# Patient Record
Sex: Female | Born: 1945 | Race: Black or African American | Hispanic: No | Marital: Married | Smoking: Never smoker
Health system: Southern US, Community
[De-identification: ages and names within clinical notes are randomized; demographics above are authoritative.]

## PROBLEM LIST (undated history)

## (undated) DIAGNOSIS — Z853 Personal history of malignant neoplasm of breast: Secondary | ICD-10-CM

## (undated) DIAGNOSIS — K649 Unspecified hemorrhoids: Secondary | ICD-10-CM

## (undated) DIAGNOSIS — C50919 Malignant neoplasm of unspecified site of unspecified female breast: Secondary | ICD-10-CM

## (undated) DIAGNOSIS — I341 Nonrheumatic mitral (valve) prolapse: Secondary | ICD-10-CM

## (undated) DIAGNOSIS — M2550 Pain in unspecified joint: Secondary | ICD-10-CM

## (undated) DIAGNOSIS — E785 Hyperlipidemia, unspecified: Secondary | ICD-10-CM

## (undated) DIAGNOSIS — J302 Other seasonal allergic rhinitis: Secondary | ICD-10-CM

## (undated) DIAGNOSIS — L659 Nonscarring hair loss, unspecified: Secondary | ICD-10-CM

## (undated) DIAGNOSIS — C50412 Malignant neoplasm of upper-outer quadrant of left female breast: Secondary | ICD-10-CM

## (undated) DIAGNOSIS — D509 Iron deficiency anemia, unspecified: Secondary | ICD-10-CM

## (undated) DIAGNOSIS — J309 Allergic rhinitis, unspecified: Secondary | ICD-10-CM

## (undated) DIAGNOSIS — Z8679 Personal history of other diseases of the circulatory system: Secondary | ICD-10-CM

## (undated) DIAGNOSIS — Z803 Family history of malignant neoplasm of breast: Secondary | ICD-10-CM

## (undated) DIAGNOSIS — K449 Diaphragmatic hernia without obstruction or gangrene: Secondary | ICD-10-CM

## (undated) DIAGNOSIS — M1990 Unspecified osteoarthritis, unspecified site: Secondary | ICD-10-CM

## (undated) HISTORY — DX: Malignant neoplasm of unspecified site of unspecified female breast: C50.919

## (undated) HISTORY — DX: Iron deficiency anemia, unspecified: D50.9

## (undated) HISTORY — DX: Malignant neoplasm of upper-outer quadrant of left female breast: C50.412

## (undated) HISTORY — DX: Personal history of malignant neoplasm of breast: Z85.3

## (undated) HISTORY — DX: Nonrheumatic mitral (valve) prolapse: I34.1

## (undated) HISTORY — PX: OTHER SURGICAL HISTORY: SHX169

## (undated) HISTORY — DX: Hyperlipidemia, unspecified: E78.5

## (undated) HISTORY — DX: Other seasonal allergic rhinitis: J30.2

## (undated) HISTORY — DX: Diaphragmatic hernia without obstruction or gangrene: K44.9

## (undated) HISTORY — DX: Unspecified osteoarthritis, unspecified site: M19.90

## (undated) HISTORY — DX: Personal history of other diseases of the circulatory system: Z86.79

## (undated) HISTORY — DX: Pain in unspecified joint: M25.50

## (undated) HISTORY — DX: Unspecified hemorrhoids: K64.9

## (undated) HISTORY — DX: Allergic rhinitis, unspecified: J30.9

## (undated) HISTORY — DX: Nonscarring hair loss, unspecified: L65.9

## (undated) HISTORY — DX: Family history of malignant neoplasm of breast: Z80.3

---

## 1997-12-13 DIAGNOSIS — C50919 Malignant neoplasm of unspecified site of unspecified female breast: Secondary | ICD-10-CM

## 1997-12-13 HISTORY — DX: Malignant neoplasm of unspecified site of unspecified female breast: C50.919

## 1997-12-13 HISTORY — PX: BREAST LUMPECTOMY: SHX2

## 1998-10-09 ENCOUNTER — Other Ambulatory Visit: Admission: RE | Admit: 1998-10-09 | Discharge: 1998-10-09 | Payer: Self-pay | Admitting: Internal Medicine

## 1998-10-10 ENCOUNTER — Ambulatory Visit (HOSPITAL_COMMUNITY): Admission: RE | Admit: 1998-10-10 | Discharge: 1998-10-10 | Payer: Self-pay | Admitting: Internal Medicine

## 1998-11-05 ENCOUNTER — Ambulatory Visit (HOSPITAL_COMMUNITY): Admission: RE | Admit: 1998-11-05 | Discharge: 1998-11-05 | Payer: Self-pay | Admitting: Internal Medicine

## 1998-11-05 ENCOUNTER — Encounter: Payer: Self-pay | Admitting: Internal Medicine

## 1998-11-19 ENCOUNTER — Encounter (HOSPITAL_BASED_OUTPATIENT_CLINIC_OR_DEPARTMENT_OTHER): Payer: Self-pay | Admitting: General Surgery

## 1998-11-21 ENCOUNTER — Encounter (HOSPITAL_BASED_OUTPATIENT_CLINIC_OR_DEPARTMENT_OTHER): Payer: Self-pay | Admitting: General Surgery

## 1998-11-21 ENCOUNTER — Ambulatory Visit (HOSPITAL_COMMUNITY): Admission: RE | Admit: 1998-11-21 | Discharge: 1998-11-21 | Payer: Self-pay | Admitting: General Surgery

## 1998-12-30 ENCOUNTER — Encounter: Admission: RE | Admit: 1998-12-30 | Discharge: 1999-03-30 | Payer: Self-pay | Admitting: *Deleted

## 1999-11-02 ENCOUNTER — Encounter: Admission: RE | Admit: 1999-11-02 | Discharge: 1999-11-02 | Payer: Self-pay | Admitting: Internal Medicine

## 2000-02-17 ENCOUNTER — Ambulatory Visit (HOSPITAL_COMMUNITY): Admission: RE | Admit: 2000-02-17 | Discharge: 2000-02-17 | Payer: Self-pay | Admitting: Gastroenterology

## 2000-02-18 ENCOUNTER — Ambulatory Visit (HOSPITAL_COMMUNITY): Admission: RE | Admit: 2000-02-18 | Discharge: 2000-02-18 | Payer: Self-pay | Admitting: Gastroenterology

## 2000-02-18 ENCOUNTER — Encounter: Payer: Self-pay | Admitting: Gastroenterology

## 2000-03-04 ENCOUNTER — Encounter: Payer: Self-pay | Admitting: Gastroenterology

## 2000-03-04 ENCOUNTER — Ambulatory Visit (HOSPITAL_COMMUNITY): Admission: RE | Admit: 2000-03-04 | Discharge: 2000-03-04 | Payer: Self-pay | Admitting: Gastroenterology

## 2000-09-16 ENCOUNTER — Other Ambulatory Visit: Admission: RE | Admit: 2000-09-16 | Discharge: 2000-09-30 | Payer: Self-pay

## 2000-11-02 ENCOUNTER — Encounter: Admission: RE | Admit: 2000-11-02 | Discharge: 2000-11-02 | Payer: Self-pay | Admitting: Internal Medicine

## 2000-11-02 ENCOUNTER — Encounter: Payer: Self-pay | Admitting: Internal Medicine

## 2002-03-23 ENCOUNTER — Other Ambulatory Visit: Admission: RE | Admit: 2002-03-23 | Discharge: 2002-03-23 | Payer: Self-pay | Admitting: Internal Medicine

## 2004-10-18 ENCOUNTER — Ambulatory Visit: Payer: Self-pay | Admitting: Oncology

## 2005-01-22 ENCOUNTER — Ambulatory Visit: Payer: Self-pay | Admitting: Oncology

## 2005-03-12 ENCOUNTER — Encounter: Admission: RE | Admit: 2005-03-12 | Discharge: 2005-03-12 | Payer: Self-pay | Admitting: Internal Medicine

## 2005-03-12 ENCOUNTER — Other Ambulatory Visit: Admission: RE | Admit: 2005-03-12 | Discharge: 2005-03-12 | Payer: Self-pay | Admitting: Internal Medicine

## 2005-06-08 ENCOUNTER — Emergency Department (HOSPITAL_COMMUNITY): Admission: EM | Admit: 2005-06-08 | Discharge: 2005-06-08 | Payer: Self-pay | Admitting: Emergency Medicine

## 2005-06-09 ENCOUNTER — Ambulatory Visit (HOSPITAL_COMMUNITY): Admission: RE | Admit: 2005-06-09 | Discharge: 2005-06-09 | Payer: Self-pay | Admitting: Internal Medicine

## 2005-11-29 ENCOUNTER — Ambulatory Visit: Payer: Self-pay | Admitting: Oncology

## 2006-03-24 ENCOUNTER — Other Ambulatory Visit: Admission: RE | Admit: 2006-03-24 | Discharge: 2006-03-24 | Payer: Self-pay | Admitting: Internal Medicine

## 2006-12-01 ENCOUNTER — Ambulatory Visit: Payer: Self-pay | Admitting: Oncology

## 2007-03-22 ENCOUNTER — Ambulatory Visit: Payer: Self-pay | Admitting: Oncology

## 2007-04-06 ENCOUNTER — Other Ambulatory Visit: Admission: RE | Admit: 2007-04-06 | Discharge: 2007-04-06 | Payer: Self-pay | Admitting: Internal Medicine

## 2007-06-22 ENCOUNTER — Ambulatory Visit: Payer: Self-pay | Admitting: Oncology

## 2007-10-11 ENCOUNTER — Ambulatory Visit: Payer: Self-pay | Admitting: Oncology

## 2008-04-09 ENCOUNTER — Ambulatory Visit: Payer: Self-pay | Admitting: Oncology

## 2008-06-20 ENCOUNTER — Other Ambulatory Visit: Admission: RE | Admit: 2008-06-20 | Discharge: 2008-06-20 | Payer: Self-pay | Admitting: Internal Medicine

## 2008-06-29 ENCOUNTER — Emergency Department (HOSPITAL_COMMUNITY): Admission: EM | Admit: 2008-06-29 | Discharge: 2008-06-29 | Payer: Self-pay | Admitting: Emergency Medicine

## 2009-01-16 ENCOUNTER — Ambulatory Visit: Payer: Self-pay | Admitting: Oncology

## 2009-04-01 ENCOUNTER — Other Ambulatory Visit: Payer: Self-pay | Admitting: Vascular Surgery

## 2009-04-08 ENCOUNTER — Other Ambulatory Visit: Payer: Self-pay | Admitting: Vascular Surgery

## 2009-04-29 ENCOUNTER — Other Ambulatory Visit: Payer: Self-pay | Admitting: Vascular Surgery

## 2009-05-03 ENCOUNTER — Other Ambulatory Visit: Payer: Self-pay | Admitting: Vascular Surgery

## 2009-05-19 ENCOUNTER — Other Ambulatory Visit: Payer: Self-pay | Admitting: Vascular Surgery

## 2009-09-05 ENCOUNTER — Other Ambulatory Visit: Admission: RE | Admit: 2009-09-05 | Discharge: 2009-09-05 | Payer: Self-pay | Admitting: Internal Medicine

## 2009-09-21 ENCOUNTER — Other Ambulatory Visit: Payer: Self-pay | Admitting: Emergency Medicine

## 2009-10-26 ENCOUNTER — Other Ambulatory Visit: Payer: Self-pay | Admitting: Emergency Medicine

## 2009-10-26 ENCOUNTER — Other Ambulatory Visit: Payer: Self-pay | Admitting: Vascular Surgery

## 2009-11-14 ENCOUNTER — Other Ambulatory Visit: Payer: Self-pay | Admitting: Surgery

## 2009-11-19 ENCOUNTER — Encounter: Admission: RE | Admit: 2009-11-19 | Discharge: 2009-11-19 | Payer: Self-pay | Admitting: Sports Medicine

## 2010-01-19 ENCOUNTER — Other Ambulatory Visit: Payer: Self-pay | Admitting: Nephrology

## 2010-11-06 ENCOUNTER — Emergency Department (HOSPITAL_COMMUNITY): Admission: EM | Admit: 2010-11-06 | Discharge: 2010-11-06 | Payer: Self-pay | Admitting: Emergency Medicine

## 2011-01-29 ENCOUNTER — Other Ambulatory Visit: Payer: Self-pay | Admitting: Internal Medicine

## 2011-02-23 LAB — POCT I-STAT, CHEM 8
BUN: 12 mg/dL (ref 6–23)
Calcium, Ion: 1.19 mmol/L (ref 1.12–1.32)
Chloride: 106 mEq/L (ref 96–112)
HCT: 38 % (ref 36.0–46.0)
Potassium: 3.6 mEq/L (ref 3.5–5.1)

## 2011-02-23 LAB — POCT CARDIAC MARKERS
CKMB, poc: <1 ng/mL — ABNORMAL LOW (ref 1.0–8.0)
Troponin i, poc: <0.05 ng/mL (ref 0.00–0.09)
Troponin i, poc: <0.05 ng/mL (ref 0.00–0.09)

## 2011-03-16 LAB — POCT I-STAT 4, (NA,K, GLUC, HGB,HCT)
Glucose, Bld: 101 mg/dL — ABNORMAL HIGH (ref 70–99)
HCT: 46 % (ref 36.0–46.0)
Hemoglobin: 15.6 g/dL — ABNORMAL HIGH (ref 12.0–15.0)
Potassium: 4.1 mEq/L (ref 3.5–5.1)

## 2011-03-17 LAB — PROTIME-INR
INR: 1.05 (ref 0.00–1.49)
Prothrombin Time: 13.6 seconds (ref 11.6–15.2)

## 2011-03-17 LAB — POCT I-STAT, CHEM 8
Calcium, Ion: 1.1 mmol/L — ABNORMAL LOW (ref 1.12–1.32)
Glucose, Bld: 99 mg/dL (ref 70–99)
HCT: 45 % (ref 36.0–46.0)
Hemoglobin: 15.3 g/dL — ABNORMAL HIGH (ref 12.0–15.0)
TCO2: 22 mmol/L (ref 0–100)

## 2011-03-18 LAB — POCT I-STAT, CHEM 8
BUN: 88 mg/dL — ABNORMAL HIGH (ref 6–23)
Calcium, Ion: 1.04 mmol/L — ABNORMAL LOW (ref 1.12–1.32)
HCT: 46 % (ref 36.0–46.0)
Hemoglobin: 15.6 g/dL — ABNORMAL HIGH (ref 12.0–15.0)
Sodium: 140 mEq/L (ref 135–145)
TCO2: 28 mmol/L (ref 0–100)

## 2011-03-22 LAB — POCT I-STAT 4, (NA,K, GLUC, HGB,HCT)
Hemoglobin: 12.9 g/dL (ref 12.0–15.0)
Potassium: 3.7 mEq/L (ref 3.5–5.1)
Sodium: 137 mEq/L (ref 135–145)

## 2011-03-23 LAB — POCT I-STAT 4, (NA,K, GLUC, HGB,HCT)
Glucose, Bld: 106 mg/dL — ABNORMAL HIGH (ref 70–99)
HCT: 43 % (ref 36.0–46.0)
Hemoglobin: 14.6 g/dL (ref 12.0–15.0)
Potassium: 3.4 mEq/L — ABNORMAL LOW (ref 3.5–5.1)

## 2011-03-24 LAB — POCT I-STAT 4, (NA,K, GLUC, HGB,HCT)
Glucose, Bld: 99 mg/dL (ref 70–99)
HCT: 45 % (ref 36.0–46.0)
HCT: 48 % — ABNORMAL HIGH (ref 36.0–46.0)
Hemoglobin: 15.3 g/dL — ABNORMAL HIGH (ref 12.0–15.0)
Hemoglobin: 16.3 g/dL — ABNORMAL HIGH (ref 12.0–15.0)
Sodium: 135 mEq/L (ref 135–145)
Sodium: 137 mEq/L (ref 135–145)

## 2011-04-30 NOTE — Procedures (Signed)
[place]. [facility]  Patient:    Jeanne Mckinney, Jeanne Mckinney                      MRN: [id number] Proc. Date: 02/17/00 Adm. Date:  [id number] Attending:  Charna Elizabeth CC:         Lind Guest. August Saucer, M.D.                           Procedure Report  DATE OF BIRTH:  June 04, 1946  REFERRING PHYSICIAN:  Lind Guest. August Saucer, M.D.  PROCEDURE PERFORMED:  Colonoscopy.  ENDOSCOPIST:  Anselmo Rod, M.D.  INSTRUMENTS USED:  Olympus video colonoscope.  INDICATION FOR PROCEDURE:  Iron deficiency anemia in a 65 year old black female  with a personal history of breast cancer.   Rule out polyps, masses, erosions or ulcerations, etc.  PREPROCEDURE PREPARATION:  Informed consent was procured from the patient. The patient was fasted for eight hours prior to the procedure and prepped with a bottle of magnesium citrate and a gallon of NuLytely the night prior to the procedure.   PREPROCEDURE PHYSICAL EXAMINATION:  VITAL SIGNS:  The patient had stable vital signs.  NECK:  Supple.  CHEST:  Clear to auscultation, S1, S2 regular.  ABDOMEN:  Soft with normal abdominal bowel sounds.  DESCRIPTION OF PROCEDURE:  The patient was placed in the left lateral decubitus  position and sedated with 50 mg of Demerol and 5 mg of Versed intravenously. Once the patient was adequately sedated and maintained on low-flow oxygen with continuous cardiac monitoring, the Olympus video colonoscope was advanced from he rectum to the cecum without difficulty.  The entire colon appeared normal with  healthy vascular pattern.  No masses, polyps, erosions, ulcerations, or diverticular disease was seen.  IMPRESSION:  Normal colonoscopy.  RECOMMENDATIONS: 1. To further evaluate the patient following the patients anemia. An upper GI and    small-bowel follow-through has been scheduled for her. 2. She is to follow up in the office for further recommendations. DD:  02/17/00 TD:  02/18/00 Job:  [id number]

## 2011-04-30 NOTE — Procedures (Signed)
[place]. [facility]  Patient:    Jeanne Mckinney, Jeanne Mckinney                      MRN: [id number] Proc. Date: 02/17/00 Adm. Date:  [id number] Attending:  Charna Elizabeth CC:         Lind Guest. August Saucer, M.D.                           Procedure Report  DATE OF BIRTH:  November 26, 1946  REFERRING PHYSICIAN:  Lind Guest. August Saucer, M.D.  PROCEDURE PERFORMED:  Colonoscopy.  ENDOSCOPIST:  Anselmo Rod, M.D.  INSTRUMENT USED:  Olympus video colonoscope.  INDICATIONS FOR PROCEDURE:  Iron-deficiency anemia in a 65 year old black female with a personal history of breast cancer without masses, polyps, erosions, ulcerations, etc.  PREPROCEDURE PREPARATION:  Informed consent was procured from the patient.  The  patient was fasted for eight hours prior to the procedure and prepped with a bottle of magnesium citrate and a gallon of NuLYTELY the night prior the procedure.  PREPROCEDURE PHYSICAL EXAMINATION:  VITAL SIGNS:  Stable.  NECK:  Supple.  CHEST:  Clear to auscultation.  HEART:  Regular.  ABDOMEN:  Soft with normal abdominal bowel sounds.  DESCRIPTION OF PROCEDURE:  The patient was placed in the left lateral decubitus  position and sedated with 50 mg of Demerol and 5 mg of Versed intravenously. Once the patient was adequately sedated and maintained on low flow oxygen and continuous cardiac monitoring, the Olympus video colonoscope was advanced from the rectum o the cecum without difficulty.  The entire colon appeared normal with healthy vascular pattern.  No masses, polyps, erosion, ulcerations or diverticular disease was seen.  IMPRESSION:  Normal colonoscopy.  RECOMMENDATIONS: 1. To further evaluate the patients iron-deficiency anemia, an upper GI small    bowel follow-through has been scheduled for her. 2. She is scheduled to follow up in the office for further recommendations. DD:  02/17/00 TD:  02/18/00 Job: [id number]

## 2013-01-10 ENCOUNTER — Encounter: Payer: Self-pay | Admitting: *Deleted

## 2013-01-10 DIAGNOSIS — J309 Allergic rhinitis, unspecified: Secondary | ICD-10-CM | POA: Insufficient documentation

## 2013-01-10 DIAGNOSIS — E785 Hyperlipidemia, unspecified: Secondary | ICD-10-CM | POA: Insufficient documentation

## 2013-01-10 DIAGNOSIS — L659 Nonscarring hair loss, unspecified: Secondary | ICD-10-CM | POA: Insufficient documentation

## 2013-01-10 DIAGNOSIS — M2550 Pain in unspecified joint: Secondary | ICD-10-CM | POA: Insufficient documentation

## 2014-04-29 ENCOUNTER — Other Ambulatory Visit: Payer: Self-pay | Admitting: Podiatry

## 2014-04-29 DIAGNOSIS — M79671 Pain in right foot: Secondary | ICD-10-CM

## 2014-04-29 DIAGNOSIS — M25571 Pain in right ankle and joints of right foot: Secondary | ICD-10-CM

## 2014-05-07 ENCOUNTER — Other Ambulatory Visit: Payer: Self-pay

## 2014-05-08 ENCOUNTER — Ambulatory Visit
Admission: RE | Admit: 2014-05-08 | Discharge: 2014-05-08 | Disposition: A | Discharge status: Home / Self Care | Payer: PRIVATE HEALTH INSURANCE | Source: Ambulatory Visit | Attending: Podiatry | Admitting: Podiatry

## 2014-05-08 DIAGNOSIS — M25571 Pain in right ankle and joints of right foot: Secondary | ICD-10-CM

## 2014-08-29 ENCOUNTER — Ambulatory Visit
Admission: RE | Admit: 2014-08-29 | Discharge: 2014-08-29 | Disposition: A | Discharge status: Home / Self Care | Payer: PRIVATE HEALTH INSURANCE | Source: Ambulatory Visit | Attending: Internal Medicine | Admitting: Internal Medicine

## 2014-08-29 ENCOUNTER — Other Ambulatory Visit: Payer: Self-pay | Admitting: Internal Medicine

## 2014-08-29 DIAGNOSIS — Z01818 Encounter for other preprocedural examination: Secondary | ICD-10-CM

## 2014-09-24 HISTORY — PX: OTHER SURGICAL HISTORY: SHX169

## 2014-09-26 HISTORY — PX: OTHER SURGICAL HISTORY: SHX169

## 2014-09-27 ENCOUNTER — Encounter: Payer: Self-pay | Admitting: Adult Health

## 2014-09-27 ENCOUNTER — Non-Acute Institutional Stay (SKILLED_NURSING_FACILITY): Payer: 59 | Admitting: Adult Health

## 2014-09-27 DIAGNOSIS — Z9660 Presence of unspecified orthopedic joint implant: Secondary | ICD-10-CM

## 2014-09-27 DIAGNOSIS — R32 Unspecified urinary incontinence: Secondary | ICD-10-CM | POA: Insufficient documentation

## 2014-09-27 DIAGNOSIS — K5900 Constipation, unspecified: Secondary | ICD-10-CM | POA: Insufficient documentation

## 2014-09-27 DIAGNOSIS — M1612 Unilateral primary osteoarthritis, left hip: Secondary | ICD-10-CM

## 2014-09-27 DIAGNOSIS — D509 Iron deficiency anemia, unspecified: Secondary | ICD-10-CM | POA: Insufficient documentation

## 2014-09-27 DIAGNOSIS — Z96642 Presence of left artificial hip joint: Secondary | ICD-10-CM | POA: Insufficient documentation

## 2014-09-27 MED ORDER — SENNA 8.6 MG PO TABS: 2.0000 | ORAL_TABLET | Freq: Every day | ORAL | Status: DC

## 2014-09-27 NOTE — Progress Notes (Signed)
Patient ID: Jeanne Mckinney, female   DOB: Sep 14, 1946, 68 y.o.   MRN: [id number]    [facility]  No Known Allergies   Chief Complaint  Patient presents with  . Hospitalization Follow-up    HPI:  She has been hospitalized for a left hip replacement. She is here for short term rehab with her goal to return back home. She is not having any pain present with her current regimen. She is having some mild constipation present.    Past Medical History  Diagnosis Date  . Breast cancer   . Hemorrhoid   . Iron deficiency anemia   . Seasonal allergies   . Mitral valve prolapse   . Hiatal hernia   . Allergic rhinitis   . Alopecia     mild  . Hyperlipidemia   . Arthralgia   . History of hypertension   . Arthritis     Past Surgical History  Procedure Laterality Date  . Breast lumpectomy  1999    left breast  . Other surgical history      cyst removal buttock  . Left hip replacement  09-24-14  . Right hip intraarticular injection  09-26-14    VITAL SIGNS BP 124/80  Pulse 82  Ht 5\' 1"  (1.549 m)  Wt 114 lb 9.6 oz (51.982 kg)  BMI 21.66 kg/m2   Patient's Medications  New Prescriptions   No medications on file  Previous Medications   ASPIRIN 325 MG TABLET    Take 325 mg by mouth 2 (two) times daily.   DOCUSATE SODIUM (COLACE) 100 MG CAPSULE    Take 100 mg by mouth 2 (two) times daily as needed for mild constipation.   FINASTERIDE (PROSCAR) 5 MG TABLET    Take 5 mg by mouth daily.   HYDROCODONE-ACETAMINOPHEN (NORCO) 7.5-325 MG PER TABLET    Take 1-2 tablets by mouth every 4 (four) hours as needed for moderate pain.   IRON CR PO    Take 15 mg by mouth.   MULTIPLE VITAMIN PO    Take 1 tablet by mouth daily.  Modified Medications   No medications on file  Discontinued Medications   ACETAMINOPHEN (TYLENOL PO)    Take 650 mg by mouth as needed.    B COMPLEX VITAMINS (B COMPLEX 100 PO)    Take by mouth.   BIOTIN PO    Take 1,000 mcg by mouth.    BLACK CURRANT SEED OIL PO     Take by mouth.   CALCIUM CARBONATE-VITAMIN D (CALCIUM 600 + D PO)    Take by mouth.   CHOLECALCIFEROL (VITAMIN D PO)    Take by mouth.   FISH OIL-OMEGA-3 FATTY ACIDS 1000 MG CAPSULE    Take 2 g by mouth daily.   FLAXSEED, LINSEED, (FLAXSEED OIL PO)    Take by mouth.   GINGER, ZINGIBER OFFICINALIS, (GINGER ROOT PO)    Take by mouth.   GLUCOSAMINE-CHONDROITIN 500-400 MG TABLET    Take 1 tablet by mouth.   IRON COMBINATIONS CAPS    Take by mouth.   MINOXIDIL, TOPICAL, 5 % SOLN    Apply topically.   TURMERIC PO    Take by mouth.    SIGNIFICANT DIAGNOSTIC EXAMS  None available      LABS REVIEWED:  09-25-14:wbc 9.3; hgb 9.0; hct 27.3; mcv 93.2; plt 194  glucose 123; bun 9; creat 0.58; k+4.1;  na++ 138    Review of Systems  Constitutional: Negative for malaise/fatigue.  Respiratory: Negative  for cough and shortness of breath.   Cardiovascular: Negative for chest pain, palpitations and leg swelling.  Gastrointestinal: Positive for constipation. Negative for heartburn and abdominal pain.  Musculoskeletal: Negative for joint pain and myalgias.  Skin: Negative.   Neurological: Negative for headaches.  Psychiatric/Behavioral: Negative for depression. The patient is not nervous/anxious.     Physical Exam  Constitutional: She is oriented to person, place, and time. She appears well-developed and well-nourished. No distress.  Thin   Neck: Neck supple. No JVD present.  Cardiovascular: Normal rate, regular rhythm and intact distal pulses.   Respiratory: Effort normal and breath sounds normal. No respiratory distress. She has no wheezes.  GI: Soft. Bowel sounds are normal. She exhibits no distension. There is no tenderness.  Musculoskeletal: She exhibits no edema.  Is able to move all extremities; is status post left hip replacement   Neurological: She is alert and oriented to person, place, and time.  Skin: Skin is warm and dry. She is not diaphoretic.  Left hip incision line without  signs of infection present      ASSESSMENT/ PLAN:  1. Osteoarthritis left hip: is status post left hip replacement: will continue therapy as directed and will follow up with orthopedics as indicated. Will continue asa 325 mg twice daily for 6 weeks. Will continue vicodin 7.5/325 mg 1 or 2 tabs every 4 hours as needed; will monitor   2. Anemia: will continue iron daily   3. 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     Ok Edwards NP [facility]  Contact [phone] Monday through Friday 8am- 5pm  After hours call [phone]

## 2014-10-04 ENCOUNTER — Encounter: Payer: Self-pay | Admitting: Internal Medicine

## 2014-10-04 ENCOUNTER — Non-Acute Institutional Stay (SKILLED_NURSING_FACILITY): Payer: 59 | Admitting: Internal Medicine

## 2014-10-04 DIAGNOSIS — M1612 Unilateral primary osteoarthritis, left hip: Secondary | ICD-10-CM

## 2014-10-04 DIAGNOSIS — D509 Iron deficiency anemia, unspecified: Secondary | ICD-10-CM

## 2014-10-04 DIAGNOSIS — K5900 Constipation, unspecified: Secondary | ICD-10-CM

## 2014-10-04 NOTE — Progress Notes (Signed)
Patient ID: Jeanne Mckinney, female   DOB: August 03, 1946, 68 y.o.   MRN: [id number]     Facility: [facility]    PCP: Alver Fisher, MD  Code Status: full code  No Known Allergies  Chief Complaint: new admission  HPI:  68 y/o female pt is here for STR post hospital admission with left hip OA. She underwent left hip arthroplasty. Her bowel movement has improved with miralax. She denies any concerns today. Pain is under control and she is working with therapy team  Review of Systems  Constitutional: Negative for fever, chills, malaise  Respiratory: Negative for cough and shortness of breath.   Cardiovascular: Negative for chest pain, palpitations and leg swelling.  Gastrointestinal: Negative for heartburn and abdominal pain.  Musculoskeletal: Negative for joint pain and myalgias.  Skin: Negative.   Neurological: Negative for headaches.  Psychiatric/Behavioral: Negative for depression. The patient is not nervous/anxious.     Past Medical History  Diagnosis Date  . Breast cancer   . Hemorrhoid   . Iron deficiency anemia   . Seasonal allergies   . Mitral valve prolapse   . Hiatal hernia   . Allergic rhinitis   . Alopecia     mild  . Hyperlipidemia   . Arthralgia   . History of hypertension   . Arthritis    Past Surgical History  Procedure Laterality Date  . Breast lumpectomy  1999    left breast  . Other surgical history      cyst removal buttock  . Left hip replacement  09-24-14  . Right hip intraarticular injection  09-26-14   Social History:   reports that she has never smoked. She has never used smokeless tobacco. She reports that she does not drink alcohol or use illicit drugs.  Family History  Problem Relation Age of Onset  . Hypertension Mother     Medications: Patient's Medications  New Prescriptions   No medications on file  Previous Medications   ASPIRIN 325 MG TABLET    Take 325 mg by mouth 2 (two) times daily.   DOCUSATE SODIUM (COLACE) 100 MG CAPSULE    Take 100 mg by mouth 2 (two) times daily as needed for mild constipation.   FINASTERIDE (PROSCAR) 5 MG TABLET    Take 5 mg by mouth daily.   HYDROCODONE-ACETAMINOPHEN (NORCO) 7.5-325 MG PER TABLET    Take 1-2 tablets by mouth every 4 (four) hours as needed for moderate pain.   IRON CR PO    Take 15 mg by mouth.   MULTIPLE VITAMIN PO    Take 1 tablet by mouth daily.   SENNA (SENOKOT) 8.6 MG TABS TABLET    Take 2 tablets (17.2 mg total) by mouth daily.  Modified Medications   No medications on file  Discontinued Medications   No medications on file     Physical Exam: Filed Vitals:   10/04/14 1354  BP: 128/80  Pulse: 66  Temp: 97 F (36.1 C)  Resp: 18  SpO2: 96%   Constitutional: elderly female in no distress.   Neck: Neck supple. No JVD present.  Cardiovascular: Normal rate, regular rhythm and intact distal pulses.   Respiratory: CTAB  GI: Soft, non tender  Musculoskeletal: left hip ROM limited, no leg edema Neurological: She is alert and oriented to person, place, and time.  Skin: Skin is warm and dry. Left hip incision line without signs of infection present     Labs reviewed: 09-25-14:wbc 9.3; hgb 9.0;  hct 27.3; mcv 93.2; plt 194  glucose 123; bun 9; creat 0.58; k+4.1;   na++ 138     Assessment/Plan  Osteoarthritis left hip status post left hip replacement: has f/u with orthopedics. continue asa 325 mg twice daily for dvt prophylaxis and prn vicodin 7.5/325 mg for pain. Continue skin care. Will have patient work with PT/OT as tolerated to regain strength and restore function.  Fall precautions are in place. Wear ted hose  Anemia Likely post op from blood loss. Continue iron supplement  Constipation continue colace, senna and miralax   Goals of care: short term rehabilitation   Blanchie Serve, MD  [facility] [phone] (Monday-Friday 8 am - 5 pm) [phone] (afterhours)

## 2014-10-09 ENCOUNTER — Non-Acute Institutional Stay (SKILLED_NURSING_FACILITY): Payer: 59 | Admitting: Adult Health

## 2014-10-09 DIAGNOSIS — Z9660 Presence of unspecified orthopedic joint implant: Secondary | ICD-10-CM

## 2014-10-09 DIAGNOSIS — K5900 Constipation, unspecified: Secondary | ICD-10-CM

## 2014-10-09 DIAGNOSIS — M1612 Unilateral primary osteoarthritis, left hip: Secondary | ICD-10-CM

## 2014-10-09 DIAGNOSIS — Z96642 Presence of left artificial hip joint: Secondary | ICD-10-CM

## 2014-10-09 NOTE — Progress Notes (Signed)
Patient ID: Jeanne Mckinney, female   DOB: 02/11/1946, 68 y.o.   MRN: [id number]     [facility]  No Known Allergies  Chief Complaint  Patient presents with  . Discharge Note     HPI:  She is being discharged to home with home health for pt/ot. She will need a front wheel walker. She will need her prescriptions to be written and will need a follow up with her pcp. She had been hospitalized for a left hip replacement.    Past Medical History  Diagnosis Date  . Breast cancer   . Hemorrhoid   . Iron deficiency anemia   . Seasonal allergies   . Mitral valve prolapse   . Hiatal hernia   . Allergic rhinitis   . Alopecia     mild  . Hyperlipidemia   . Arthralgia   . History of hypertension   . Arthritis     Past Surgical History  Procedure Laterality Date  . Breast lumpectomy  1999    left breast  . Other surgical history      cyst removal buttock  . Left hip replacement  09-24-14  . Right hip intraarticular injection  09-26-14    VITAL SIGNS BP 105/63  Pulse 69  Ht 5\' 1"  (1.549 m)  Wt 114 lb (51.71 kg)  BMI 21.55 kg/m2   Patient's Medications  New Prescriptions   No medications on file  Previous Medications   ASPIRIN 325 MG TABLET    Take 325 mg by mouth 2 (two) times daily.   DOCUSATE SODIUM (COLACE) 100 MG CAPSULE    Take 100 mg by mouth 2 (two) times daily as needed for mild constipation.   FINASTERIDE (PROSCAR) 5 MG TABLET    Take 5 mg by mouth daily.   HYDROCODONE-ACETAMINOPHEN (NORCO) 7.5-325 MG PER TABLET    Take 1-2 tablets by mouth every 4 (four) hours as needed for moderate pain.   IRON CR PO    Take 15 mg by mouth.   MULTIPLE VITAMIN PO    Take 1 tablet by mouth daily.   SENNA (SENOKOT) 8.6 MG TABS TABLET    Take 2 tablets (17.2 mg total) by mouth daily.  Modified Medications   No medications on file  Discontinued Medications   No medications on file    SIGNIFICANT DIAGNOSTIC EXAMS   LABS REVIEWED:  09-25-14:wbc 9.3; hgb 9.0; hct 27.3;  mcv 93.2; plt 194  glucose 123; bun 9; creat 0.58; k+4.1;  na++ 138    Review of Systems  Constitutional: Negative for malaise/fatigue.  Respiratory: Negative for cough and shortness of breath.   Cardiovascular: Negative for chest pain, palpitations and leg swelling.  Gastrointestinal: negative for constipation. Negative for heartburn and abdominal pain.  Musculoskeletal: Negative for joint pain and myalgias.  Skin: Negative.   Neurological: Negative for headaches.  Psychiatric/Behavioral: Negative for depression. The patient is not nervous/anxious.     Physical Exam  Constitutional: She is oriented to person, place, and time. She appears well-developed and well-nourished. No distress.  Thin   Neck: Neck supple. No JVD present.  Cardiovascular: Normal rate, regular rhythm and intact distal pulses.   Respiratory: Effort normal and breath sounds normal. No respiratory distress. She has no wheezes.  GI: Soft. Bowel sounds are normal. She exhibits no distension. There is no tenderness.  Musculoskeletal: She exhibits no edema.  Is able to move all extremities; is status post left hip replacement   Neurological: She is alert and  oriented to person, place, and time.  Skin: Skin is warm and dry. She is not diaphoretic.  Left hip incision line without signs of infection present       ASSESSMENT/ PLAN:  Will discharge to home with home health for pt/ot to improve upon her strength mobility and independence with adls. Will need a front wheel walker in order for her to maintain her current level of independence with her adls. Her prescriptions have been written for a 30 day supply of medications with vicodin 7.5/325 mg #40 tabs. She has a follow up appointment with her pcp Kevan Ny on 10-31-14 at 2: 30 pm    Time spent with patient 40 minutes.    Ok Edwards NP [facility]  Contact [phone] Monday through Friday 8am- 5pm  After hours call [phone]

## 2014-11-29 ENCOUNTER — Encounter: Payer: Self-pay | Admitting: Podiatrist

## 2014-11-29 ENCOUNTER — Telehealth: Payer: Self-pay

## 2014-11-29 ENCOUNTER — Ambulatory Visit (INDEPENDENT_AMBULATORY_CARE_PROVIDER_SITE_OTHER): Payer: 59 | Admitting: Podiatrist

## 2014-11-29 VITALS — BP 124/73 | HR 62 | Resp 16

## 2014-11-29 DIAGNOSIS — L03012 Cellulitis of left finger: Secondary | ICD-10-CM

## 2014-11-29 NOTE — Progress Notes (Signed)
   Subjective:    Patient ID: Jeanne Mckinney, female    DOB: 1946-05-03, 68 y.o.   MRN: [id number]  HPI Comments: "This toe was red and still is not better"  Patient c/o tender 1st toe left, medial border of toenail, for about 1 month. The area was very red and swollen. She went to see Dr. Marlou Sa (PCP) and he Rx'd antibiotic. Completed and is better, but still slightly red and sore. She is scheduled for hip replacement on January 5th and wants to make sure there is no infection.     Review of Systems  Constitutional: Positive for diaphoresis.  Musculoskeletal: Positive for arthralgias and gait problem.  All other systems reviewed and are negative.      Objective:   Physical Exam  Patient is awake, alert, and oriented x 3.  In no acute distress.  Vascular status is intact with palpable pedal pulses at 2/4 DP and PT bilateral and capillary refill time within normal limits. Neurological sensation is also intact bilaterally via Semmes Weinstein monofilament at 5/5 sites. Light touch, vibratory sensation, Achilles tendon reflex is intact. Dermatological exam reveals skin color, turger and texture as normal. No open lesions present.  Musculature intact with dorsiflexion, plantarflexion, inversion, eversion.  Left hallux nail is painful on the medial border.  mild redness and swelling is present. No drainage or malodor noted.  Pain with pressure noted      Assessment & Plan:  Paronychia left hallux medial nail border  Plan:  Incision and drainage performed on the medial nail border of the left hallux. This is performed under sterile technique and she tolerated the procedure very well. She was given instructions for after care including antibacterial soap instructions and she will be seen back as needed for follow-up. If she notices any redness, drainage or swelling she is instructed to call immediately.

## 2014-11-29 NOTE — Telephone Encounter (Signed)
Pt asked how many days does she need to continue the antibacterial cleanses.  I informed the pt she would be cleansing the area after each shower for 4 - 6 weeks and applying Neosporin ointment, then at the end of 4th week do the last cleansing of the day, leave off the the Neosporin and bandaid, the the area got a dry hard scab could stop the cleanses.

## 2014-11-29 NOTE — Patient Instructions (Signed)

## 2014-12-20 ENCOUNTER — Non-Acute Institutional Stay (SKILLED_NURSING_FACILITY): Payer: Medicare Other | Admitting: Registered Nurse

## 2014-12-20 ENCOUNTER — Encounter: Payer: Self-pay | Admitting: Registered Nurse

## 2014-12-20 DIAGNOSIS — D509 Iron deficiency anemia, unspecified: Secondary | ICD-10-CM

## 2014-12-20 DIAGNOSIS — K5900 Constipation, unspecified: Secondary | ICD-10-CM

## 2014-12-20 DIAGNOSIS — Z96641 Presence of right artificial hip joint: Secondary | ICD-10-CM

## 2014-12-20 DIAGNOSIS — Z9660 Presence of unspecified orthopedic joint implant: Secondary | ICD-10-CM

## 2014-12-20 NOTE — Progress Notes (Signed)
Patient ID: Jeanne Mckinney, female   DOB: [birth year], 69 y.o.   MRN: [id number]   Place of Service: [facility]  No Known Allergies  Code Status: Full Code  Goals of Care: Longevity/STR  Chief Complaint  Patient presents with  . Hospitalization Follow-up    HPI 69 y.o. female with PMH OA, s/p left hip replacement, breast cancer, iron deficiency anemia among others is being seen for a follow-up visit post hospital admission from 12/17/14 to 1/716 for elective Right total hip replacement from [facility]. She is here for STR and her goal is to return home. Seen room today. Spouse at bedside. No complaints verbalized by patient. Pain is adequately controlled with current regimen.   Review of Systems Constitutional: Negative for fever, chills, and fatigue. HENT: Negative for ear pain, congestion, and sore throat Eyes: Negative for eye pain, eye discharge, and visual disturbance  Cardiovascular: Negative for chest pain, palpitations, and leg swelling Respiratory: Negative cough, shortness of breath, and wheezing.  Gastrointestinal: Negative for nausea and vomiting. Negative for abdominal pain, diarrhea and constipation.  Genitourinary: Negative for  dysuria, frequency, urgency, and hematuria Endocrine: Negative for polydipsia, polyphagia, and polyuria Musculoskeletal: Negative for back pain. Positive for right hip pain with adequate pain relief from medication   Neurological: Negative for dizziness, headache, weakness, and tremors.  Skin: Negative for rash and wound.   Psychiatric: Negative for depression  Past Medical History  Diagnosis Date  . Breast cancer   . Hemorrhoid   . Iron deficiency anemia   . Seasonal allergies   . Mitral valve prolapse   . Hiatal hernia   . Allergic rhinitis   . Alopecia     mild  . Hyperlipidemia   . Arthralgia   . History of hypertension   . Arthritis     Past Surgical History  Procedure Laterality Date  .  Breast lumpectomy  1999    left breast  . Other surgical history      cyst removal buttock  . Left hip replacement  09-24-14  . Right hip intraarticular injection  09-26-14    History  Substance Use Topics  . Smoking status: Never Smoker   . Smokeless tobacco: Never Used  . Alcohol Use: No    Family History  Problem Relation Age of Onset  . Hypertension Mother       Medication List       This list is accurate as of: 12/20/14  1:56 PM.  Always use your most recent med list.               aspirin 325 MG tablet  Take 325 mg by mouth 2 (two) times daily.     docusate sodium 100 MG capsule  Commonly known as:  COLACE  Take 100 mg by mouth 2 (two) times daily as needed for mild constipation.     ferrous sulfate 325 (65 FE) MG tablet  Take 325 mg by mouth 2 (two) times daily with a meal.     oxycodone 5 MG capsule  Commonly known as:  OXY-IR  Take 5 mg by mouth every 4 (four) hours as needed for pain.        Physical Exam  BP 103/72 mmHg  Pulse 86  Temp(Src) 98.1 F (36.7 C)  Resp 20  Ht 5\' 1"  (1.549 m)  Wt 109 lb (49.442 kg)  BMI 20.61 kg/m2  Constitutional: WDWN adult/elderly female in no acute distress. Conversant and pleasant  HEENT: Normocephalic and atraumatic. PERRL. EOM intact. No icterus. No nasal discharge or sinus tenderness. Oral mucosa moist. Posterior pharynx clear of any exudate or lesions.  Neck: Supple and nontender. No lymphadenopathy, masses, or thyromegaly. No JVD or carotid bruits. Cardiac: Normal S1, S2. RRR without appreciable murmurs, rubs, or gallops. Distal pulses intact. No dependent edema.  Lungs: No respiratory distress. Breath sounds clear bilaterally without rales, rhonchi, or wheezes. Abdomen: Audible bowel sounds in all quadrants. Soft, nontender, nondistended. No palpable mass.  Musculoskeletal: Normal range of motion. S/p Right hip replacement. Surgical dressing intact Skin: Warm and dry. No rash noted. No erythema.    Neurological: Alert and oriented to person, place, and time. No focal deficits.  Psychiatric: Judgment and insight adequate. Appropriate mood and affect.   Labs Reviewed Reviewed   Assessment & Plan 1. Anemia, iron deficiency Hgb/hct 9.2/28.6 from discharge summary. Continue ferrous sulfate 325mg  twice daily. Monitor h&h  2. Constipation, unspecified constipation type Stable. Continue colace 100mg  twice daily as needed for constipation. Encourage hydration and ambulation as tolerated.   3. Status post right hip replacement Pain is adequately controlled with current regimen. Continue to work with PT/OT for gait/strenght/balance training to restore/maximize function. Continue asa 325mg  twice daily x 6 weeks for DVT prophylaxis and oxycodone 5mg  every four hours as needed for pain. Continue to f/u with Dr. Josiah Lobo on 01/01/15 at 12:45 as scheduled.   Diagnostic Studies/Labs Ordered: cbc  Time spent: 35 minutes on care coordination   Family/Staff Communication Plan of care discussed with patient and nursing staff. Patient and nursing staff verbalized understanding and agree with plan of care. No additional questions or concerns reported.    Arthur Holms, MSN, AGNP-C [facility] [address] [place], [place] [address] [phone] [8am-5pm] After hours: [phone]

## 2014-12-23 ENCOUNTER — Non-Acute Institutional Stay (SKILLED_NURSING_FACILITY): Payer: Medicare Other | Admitting: Internal Medicine

## 2014-12-23 DIAGNOSIS — D509 Iron deficiency anemia, unspecified: Secondary | ICD-10-CM

## 2014-12-23 DIAGNOSIS — K5900 Constipation, unspecified: Secondary | ICD-10-CM

## 2014-12-23 DIAGNOSIS — M1611 Unilateral primary osteoarthritis, right hip: Secondary | ICD-10-CM

## 2014-12-23 LAB — CBC AND DIFFERENTIAL
HCT: 27 % — AB (ref 36–46)
Hemoglobin: 8.6 g/dL — AB (ref 12.0–16.0)
PLATELETS: 255 10*3/uL (ref 150–399)
WBC: 5.6 10^3/mL

## 2014-12-23 NOTE — Progress Notes (Signed)
Patient ID: Jeanne Mckinney, female   DOB: Apr 05, 1946, 69 y.o.   MRN: [id number]     [facility]   PCP: Alver Fisher, MD  Code Status: full code  No Known Allergies  Chief Complaint  Patient presents with  . New Admit To SNF     HPI:  69 year old patient is here for short term rehabilitation post hospital admission from 12/17/14 to 1/716 for elective right total hip replacement. She had the procedure at [facility].  She has PMH of OA s/p left hip replacement, breast cancer, iron deficiency anemia among others. She is seen in her room today. She denies any concerns. Pain is under control with current regimen. Has regular bowel movement.    Review of Systems:  Constitutional: Negative for fever, chills, diaphoresis.  HENT: Negative for headache, congestion, nasal discharge Eyes: Negative for eye pain, blurred vision, double vision and discharge.  Respiratory: Negative for cough, shortness of breath and wheezing.   Cardiovascular: Negative for chest pain, palpitations  Gastrointestinal: Negative for heartburn, nausea, vomiting, abdominal pain Genitourinary: Negative for dysuria  Musculoskeletal: Negative for back pain, falls Skin: Negative for itching, rash.  Neurological: Negative for dizziness, tingling, focal weakness Psychiatric/Behavioral: Negative for depression    Past Medical History  Diagnosis Date  . Breast cancer   . Hemorrhoid   . Iron deficiency anemia   . Seasonal allergies   . Mitral valve prolapse   . Hiatal hernia   . Allergic rhinitis   . Alopecia     mild  . Hyperlipidemia   . Arthralgia   . History of hypertension   . Arthritis    Past Surgical History  Procedure Laterality Date  . Breast lumpectomy  1999    left breast  . Other surgical history      cyst removal buttock  . Left hip replacement  09-24-14  . Right hip intraarticular injection  09-26-14   Social History:   reports that  she has never smoked. She has never used smokeless tobacco. She reports that she does not drink alcohol or use illicit drugs.  Family History  Problem Relation Age of Onset  . Hypertension Mother     Medications: Patient's Medications  New Prescriptions   No medications on file  Previous Medications   ASPIRIN 325 MG TABLET    Take 325 mg by mouth 2 (two) times daily.   DOCUSATE SODIUM (COLACE) 100 MG CAPSULE    Take 100 mg by mouth 2 (two) times daily as needed for mild constipation.   FERROUS SULFATE 325 (65 FE) MG TABLET    Take 325 mg by mouth 2 (two) times daily with a meal.   OXYCODONE (OXY-IR) 5 MG CAPSULE    Take 5 mg by mouth every 4 (four) hours as needed for pain.  Modified Medications   No medications on file  Discontinued Medications   No medications on file     Physical Exam: Filed Vitals:   12/23/14 1231  BP: 115/74  Pulse: 78  Temp: 98.4 F (36.9 C)  Resp: 18  SpO2: 97%    General- elderly female, well built, in no acute distress Head- normocephalic, atraumatic Throat- moist mucus membrane Eyes- PERRLA, EOMI, no pallor, no icterus, no discharge, normal conjunctiva, normal sclera Neck- no cervical lymphadenopathy Cardiovascular- normal s1,s2, no murmurs, palpable dorsalis pedis and radial pulses, right leg 1+ edema Respiratory- bilateral clear to auscultation, no wheeze, no rhonchi, no crackles, no  use of accessory muscles Abdomen- bowel sounds present, soft, non tender Musculoskeletal- able to move all 4 extremities, using wheelchair and walker, limited ROM with left hip, right thigh surgical scar  oNeurological- no focal deficit Skin- warm and dry, aquacel dressing on right hip area- clean and dry Psychiatry- alert and oriented to person, place and time, normal mood and affect   Labs reviewed  Assessment/Plan  Right hip OA S/p right hip arthroplasty. Pain under control. Continue oxycodone 5mg  every four hours as needed for pain and aspirin 325 mg  bid for DVT prophylaxis. Will have her work with physical therapy and occupational therapy team to help with gait training and muscle strengthening exercises.fall precautions. Skin care. Encourage to be out of bed. WBAT. Has f/u with ortho Dr Josiah Lobo  Anemia, iron deficiency Continue ferrous sulfate 325mg  twice daily. Check cbc next week  Constipation Stable. Continue colace 100mg  twice daily as needed for constipation. Encourage hydration and ambulation as tolerated.    Goals of care: short term rehabilitation   Labs/tests ordered: cbc  Family/ staff Communication: reviewed care plan with patient and nursing supervisor    Blanchie Serve, MD  [facility] [phone] (Monday-Friday 8 am - 5 pm) [phone] (afterhours)

## 2015-01-02 ENCOUNTER — Non-Acute Institutional Stay (SKILLED_NURSING_FACILITY): Payer: Medicare Other | Admitting: Registered Nurse

## 2015-01-02 ENCOUNTER — Encounter: Payer: Self-pay | Admitting: Registered Nurse

## 2015-01-02 DIAGNOSIS — D509 Iron deficiency anemia, unspecified: Secondary | ICD-10-CM

## 2015-01-02 DIAGNOSIS — K5900 Constipation, unspecified: Secondary | ICD-10-CM

## 2015-01-02 DIAGNOSIS — M1611 Unilateral primary osteoarthritis, right hip: Secondary | ICD-10-CM

## 2015-01-02 NOTE — Progress Notes (Signed)
Patient ID: Jeanne Mckinney, female   DOB: [birth year], 69 y.o.   MRN: [id number]   Place of Service: [facility]  No Known Allergies  Code Status: Full Code  Goals of Care: Longevity/STR  Chief Complaint  Patient presents with  . Discharge Note    HPI 69 y.o. female with PMH OA, s/p left hip replacement, breast cancer, iron deficiency anemia among others is being seen for a discharge visit. She was here for short-term rehabilitation post hospital admission from 12/17/14 to 1/716 for elective Right total hip replacement from [facility]. She has worked well with therapy team and is ready to be discharged home with [facility] PT and no DME. Seen in room today. No complaints verbalized by patient. Pain is adequately controlled with current regiment. No issues with constipation   Review of Systems Constitutional: Negative for fever, chills, and fatigue. HENT: Negative for ear pain, congestion, and sore throat Eyes: Negative for eye pain, eye discharge, and visual disturbance  Cardiovascular: Negative for chest pain, palpitations, and leg swelling Respiratory: Negative cough, shortness of breath, and wheezing.  Gastrointestinal: Negative for nausea and vomiting. Negative for abdominal pain, diarrhea and constipation.  Genitourinary: Negative for  dysuria, frequency, urgency, and hematuria Endocrine: Negative for polydipsia, polyphagia, and polyuria Musculoskeletal: Negative for back pain. Positive for right hip pain with adequate pain relief from medication   Neurological: Negative for dizziness and headache Skin: Negative for rash and wound.   Psychiatric: Negative for depression  Past Medical History  Diagnosis Date  . Breast cancer   . Hemorrhoid   . Iron deficiency anemia   . Seasonal allergies   . Mitral valve prolapse   . Hiatal hernia   . Allergic rhinitis   . Alopecia     mild  . Hyperlipidemia   . Arthralgia   . History of hypertension   .  Arthritis     Past Surgical History  Procedure Laterality Date  . Breast lumpectomy  1999    left breast  . Other surgical history      cyst removal buttock  . Left hip replacement  09-24-14  . Right hip intraarticular injection  09-26-14    History  Substance Use Topics  . Smoking status: Never Smoker   . Smokeless tobacco: Never Used  . Alcohol Use: No    Family History  Problem Relation Age of Onset  . Hypertension Mother       Medication List       This list is accurate as of: 01/02/15  1:09 PM.  Always use your most recent med list.               aspirin 325 MG tablet  Take 325 mg by mouth 2 (two) times daily.     docusate sodium 100 MG capsule  Commonly known as:  COLACE  Take 100 mg by mouth 2 (two) times daily as needed for mild constipation.     ferrous sulfate 325 (65 FE) MG tablet  Take 325 mg by mouth 2 (two) times daily with a meal.     oxycodone 5 MG capsule  Commonly known as:  OXY-IR  Take 5 mg by mouth every 4 (four) hours as needed for pain.        Physical Exam  BP 103/67 mmHg  Pulse 69  Temp(Src) 98.9 F (37.2 C)  Resp 20  Ht 5\' 1"  (1.549 m)  Wt 112 lb 6.4 oz (50.984 kg)  BMI 21.25 kg/m2  Constitutional: WDWN adult/elderly female in no acute distress. Conversant and pleasant HEENT: Normocephalic and atraumatic. PERRL. EOM intact. No icterus. No nasal discharge or sinus tenderness. Oral mucosa moist. Posterior pharynx clear of any exudate or lesions.  Neck: Supple and nontender. No lymphadenopathy, masses, or thyromegaly. No JVD or carotid bruits. Cardiac: Normal S1, S2. RRR without appreciable murmurs, rubs, or gallops. Distal pulses intact. No dependent edema.  Lungs: No respiratory distress. Breath sounds clear bilaterally without rales, rhonchi, or wheezes. Abdomen: Audible bowel sounds in all quadrants. Soft, nontender, nondistended. No palpable mass.  Musculoskeletal: Normal range of motion. S/p Right hip replacement. Right  hip surgical incision w/o signs of infection. Sutures in place.  Skin: Warm and dry. No rash noted. No erythema.  Neurological: Alert and oriented to person, place, and time. No focal deficits.  Psychiatric: Judgment and insight adequate. Appropriate mood and affect.   Labs Reviewed CBC    Component Value Date/Time   WBC 5.6 12/23/2014   HGB 8.6* 12/23/2014   HCT 27* 12/23/2014   PLT 255 12/23/2014    Assessment & Plan 1. Anemia, iron deficiency Recent Hgb/hct decreased from 9.2/28.6 from discharge summary to 8.6/27. Continue ferrous sulfate 325mg  twice daily. PCP to monitor h&h  2. Constipation, unspecified constipation type No issues. Continue colace 100mg  twice daily as needed for constipation. Encourage hydration and ambulation as tolerated.   3. Right hip OA S/p right hip replacement Pain is adequately controlled with current regimen. Continue to work with [facility] PT for gait/strenght/balance training to restore/maximize function. Continue and complete asa 325mg  twice daily for another 4 weeks for DVT prophylaxis  and oxycodone 5mg  every four hours as needed for pain. Continue to f/u with Dr. Josiah Lobo   Home health services: PT DME required: None PCP follow-up: Dr. Kevan Ny on 01/24/15 @ 11:30am 30-day supply of prescription medications provided (#30 of oxycodone 5mg )  Time spent: 35 minutes on care coordination   Family/Staff Communication Plan of care discussed with patient and nursing staff. Patient and nursing staff verbalized understanding and agree with plan of care. No additional questions or concerns reported.    Arthur Holms, MSN, AGNP-C [facility] [address] [place], [place] [address] [phone] [8am-5pm] After hours: [phone]

## 2016-03-25 ENCOUNTER — Encounter: Payer: PRIVATE HEALTH INSURANCE | Attending: Internal Medicine | Admitting: Dietician

## 2016-03-25 ENCOUNTER — Encounter: Payer: Self-pay | Admitting: Dietician

## 2016-03-25 VITALS — Ht 62.0 in | Wt 119.0 lb

## 2016-03-25 DIAGNOSIS — E162 Hypoglycemia, unspecified: Secondary | ICD-10-CM

## 2016-03-25 DIAGNOSIS — E639 Nutritional deficiency, unspecified: Secondary | ICD-10-CM | POA: Insufficient documentation

## 2016-03-25 NOTE — Progress Notes (Signed)
  Medical Nutrition Therapy:  Appt start time: 1000 end time:  1030.   Assessment:  Primary concerns today: Jeanne Mckinney is here today since she has a family hx of diabetes and has a hx of hypoglycemia. 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 Has to carry food in her purse so blood sugar doesn't drop when she is out and about. Thinks she eats better than she used to and tries to not eat as many carbohydrates. Has gone to [employer] in the past to learn about portion sizes (was not trying to lose weight). Used to eat just one large meal.  Does not have a meter to test blood sugar but just feels like it is low (hungry, low energy). Has mentioned it to a doctor. Has been eating this way for awhile. Tends to have low blood sugar overnight or the next day after exercise (2 classes back to back). When she has a low blood sugar has fruit or dried fruit and nuts and feel better. 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  Used to swim but has not been doing that lately.  Preferred Learning Style:   No preference indicated   Learning Readiness:   Ready   MEDICATIONS: see list   DIETARY INTAKE:  Usual eating pattern includes 3 meals and 2 snacks per day.  Avoided foods include lactose, wheat, rye, butter, pork, red meat, limits sugar and salt  24-hr recall:  B ( AM): tea with Collgelatin   Snk ( AM): Vega protein shake with flax and fruit and kale or fruit and 6 nuts  L ( PM): bone broth with tomatoes, vegetables, edamame and pintos Snk ( PM): fruit and apple with almond butter or 6 nuts  D ( PM): chicken or fish with green vegetable Snk ( PM): fruit with nuts Beverages: water, decaf coffee with milk or cream  Usual physical activity: water fitness classes  Estimated energy needs: 1600 calories 180 g carbohydrates 120 g protein 44 g fat  Progress Towards Goal(s):  In progress.   Nutritional  Diagnosis:  NI-5.8.1 Inadequate carbohydrate intake As related to hx of hypoglycemia.  As evidenced by diet recall.    Intervention:  Nutrition counseling provided: Plan: Add a carbohydrate to morning before teaching exercise classes (oatmeal). Add a carbohydrate to evening meal (potato, lentils, sweet potatoes). Pair carbs with protein for snacks.  Teaching Method Utilized:  Visual Auditory Hands on  Handouts given during visit include:  Meal Card  Barriers to learning/adherence to lifestyle change: none  Demonstrated degree of understanding via:  Teach Back   Monitoring/Evaluation:  Dietary intake, exercise, and body weight prn.

## 2016-03-25 NOTE — Patient Instructions (Addendum)
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 Add a carbohydrate to evening meal (potato, lentils, sweet potatoes). Pair carbs with protein for snacks.

## 2016-04-08 ENCOUNTER — Encounter: Payer: Self-pay | Admitting: Internal Medicine

## 2019-08-24 ENCOUNTER — Other Ambulatory Visit: Payer: Self-pay

## 2019-08-24 ENCOUNTER — Other Ambulatory Visit: Payer: Self-pay | Admitting: Internal Medicine

## 2019-08-24 ENCOUNTER — Ambulatory Visit
Admission: RE | Admit: 2019-08-24 | Discharge: 2019-08-24 | Disposition: A | Discharge status: Home / Self Care | Payer: PRIVATE HEALTH INSURANCE | Source: Ambulatory Visit | Attending: Internal Medicine | Admitting: Internal Medicine

## 2019-08-24 DIAGNOSIS — M25552 Pain in left hip: Secondary | ICD-10-CM

## 2020-11-24 ENCOUNTER — Other Ambulatory Visit: Payer: Self-pay

## 2020-11-24 ENCOUNTER — Other Ambulatory Visit: Payer: Self-pay | Admitting: Internal Medicine

## 2020-11-24 ENCOUNTER — Ambulatory Visit
Admission: RE | Admit: 2020-11-24 | Discharge: 2020-11-24 | Disposition: A | Discharge status: Home / Self Care | Payer: PRIVATE HEALTH INSURANCE | Source: Ambulatory Visit | Attending: Internal Medicine | Admitting: Internal Medicine

## 2020-11-24 DIAGNOSIS — R52 Pain, unspecified: Secondary | ICD-10-CM

## 2020-12-16 ENCOUNTER — Other Ambulatory Visit: Payer: PRIVATE HEALTH INSURANCE

## 2020-12-16 DIAGNOSIS — Z20822 Contact with and (suspected) exposure to covid-19: Secondary | ICD-10-CM

## 2020-12-18 LAB — SARS-COV-2, NAA 2 DAY TAT

## 2020-12-18 LAB — NOVEL CORONAVIRUS, NAA: SARS-CoV-2, NAA: NOT DETECTED

## 2022-01-12 ENCOUNTER — Other Ambulatory Visit: Payer: Self-pay | Admitting: Orthopaedic Surgery

## 2022-01-12 DIAGNOSIS — M25512 Pain in left shoulder: Secondary | ICD-10-CM

## 2022-01-15 ENCOUNTER — Other Ambulatory Visit: Payer: Self-pay

## 2022-01-15 ENCOUNTER — Encounter (HOSPITAL_BASED_OUTPATIENT_CLINIC_OR_DEPARTMENT_OTHER): Payer: Self-pay | Admitting: Orthopaedic Surgery

## 2022-01-18 ENCOUNTER — Encounter (HOSPITAL_BASED_OUTPATIENT_CLINIC_OR_DEPARTMENT_OTHER)
Admission: RE | Admit: 2022-01-18 | Discharge: 2022-01-18 | Disposition: A | Discharge status: Home / Self Care | Payer: PRIVATE HEALTH INSURANCE | Source: Ambulatory Visit | Attending: Orthopaedic Surgery | Admitting: Orthopaedic Surgery

## 2022-01-18 DIAGNOSIS — Z01812 Encounter for preprocedural laboratory examination: Secondary | ICD-10-CM | POA: Diagnosis not present

## 2022-01-18 LAB — SURGICAL PCR SCREEN
MRSA, PCR: NEGATIVE
Staphylococcus aureus: POSITIVE — AB

## 2022-01-18 NOTE — Progress Notes (Signed)
° ° ° ° ° °  Patient Instructions  The night before surgery:  No food after midnight. ONLY clear liquids after midnight  The day of surgery (if you do NOT have diabetes):  Drink ONE (1) Pre-Surgery Clear Ensure as directed.   This drink was given to you during your hospital  pre-op appointment visit. The pre-op nurse will instruct you on the time to drink the  Pre-Surgery Ensure depending on your surgery time. Finish the drink at the designated time by the pre-op nurse.  Nothing else to drink after completing the  Pre-Surgery Clear Ensure.  The day of surgery (if you have diabetes): Drink ONE (1) Gatorade 2 (G2) as directed. This drink was given to you during your hospital  pre-op appointment visit.  The pre-op nurse will instruct you on the time to drink the   Gatorade 2 (G2) depending on your surgery time. Color of the Gatorade may vary. Red is not allowed. Nothing else to drink after completing the  Gatorade 2 (G2).         If you have questions, please contact your surgeons office.   Pre surgical soap given with instructions.

## 2022-01-20 ENCOUNTER — Ambulatory Visit
Admission: RE | Admit: 2022-01-20 | Discharge: 2022-01-20 | Disposition: A | Discharge status: Home / Self Care | Payer: Medicare PPO | Source: Ambulatory Visit | Attending: Orthopaedic Surgery | Admitting: Orthopaedic Surgery

## 2022-01-20 DIAGNOSIS — M25512 Pain in left shoulder: Secondary | ICD-10-CM

## 2022-01-27 ENCOUNTER — Ambulatory Visit (HOSPITAL_BASED_OUTPATIENT_CLINIC_OR_DEPARTMENT_OTHER)
Admission: RE | Admit: 2022-01-27 | Discharge: 2022-01-27 | Disposition: A | Discharge status: Home / Self Care | Payer: Medicare PPO | Attending: Orthopaedic Surgery | Admitting: Orthopaedic Surgery

## 2022-01-27 ENCOUNTER — Ambulatory Visit (HOSPITAL_BASED_OUTPATIENT_CLINIC_OR_DEPARTMENT_OTHER): Payer: Medicare PPO | Admitting: Certified Registered"

## 2022-01-27 ENCOUNTER — Other Ambulatory Visit: Payer: Self-pay

## 2022-01-27 ENCOUNTER — Encounter (HOSPITAL_BASED_OUTPATIENT_CLINIC_OR_DEPARTMENT_OTHER): Payer: Self-pay | Admitting: Orthopaedic Surgery

## 2022-01-27 ENCOUNTER — Ambulatory Visit (HOSPITAL_COMMUNITY): Payer: Medicare PPO

## 2022-01-27 ENCOUNTER — Encounter (HOSPITAL_BASED_OUTPATIENT_CLINIC_OR_DEPARTMENT_OTHER)
Admission: RE | Disposition: A | Discharge status: Home / Self Care | Payer: Self-pay | Source: Home / Self Care | Attending: Orthopaedic Surgery

## 2022-01-27 DIAGNOSIS — M19012 Primary osteoarthritis, left shoulder: Secondary | ICD-10-CM | POA: Insufficient documentation

## 2022-01-27 DIAGNOSIS — D649 Anemia, unspecified: Secondary | ICD-10-CM

## 2022-01-27 DIAGNOSIS — I10 Essential (primary) hypertension: Secondary | ICD-10-CM | POA: Insufficient documentation

## 2022-01-27 DIAGNOSIS — Z853 Personal history of malignant neoplasm of breast: Secondary | ICD-10-CM | POA: Insufficient documentation

## 2022-01-27 DIAGNOSIS — Z79899 Other long term (current) drug therapy: Secondary | ICD-10-CM | POA: Diagnosis not present

## 2022-01-27 DIAGNOSIS — E785 Hyperlipidemia, unspecified: Secondary | ICD-10-CM | POA: Insufficient documentation

## 2022-01-27 DIAGNOSIS — K449 Diaphragmatic hernia without obstruction or gangrene: Secondary | ICD-10-CM

## 2022-01-27 DIAGNOSIS — I341 Nonrheumatic mitral (valve) prolapse: Secondary | ICD-10-CM | POA: Diagnosis not present

## 2022-01-27 DIAGNOSIS — Z09 Encounter for follow-up examination after completed treatment for conditions other than malignant neoplasm: Secondary | ICD-10-CM

## 2022-01-27 HISTORY — PX: TOTAL SHOULDER ARTHROPLASTY: SHX126

## 2022-01-27 SURGERY — ARTHROPLASTY, SHOULDER, TOTAL
Anesthesia: Regional | Site: Shoulder | Laterality: Left

## 2022-01-27 MED ORDER — FENTANYL CITRATE (PF) 100 MCG/2ML IJ SOLN: 25.0000 ug | INTRAMUSCULAR | Status: DC | PRN

## 2022-01-27 MED ORDER — VANCOMYCIN HCL 1000 MG IV SOLR
INTRAVENOUS | Status: DC | PRN
  Administered 2022-01-27: 1000 mg via TOPICAL

## 2022-01-27 MED ORDER — SODIUM CHLORIDE 0.9 % IR SOLN
Status: DC | PRN
  Administered 2022-01-27: 3000 mL

## 2022-01-27 MED ORDER — ACETAMINOPHEN 500 MG PO TABS
ORAL_TABLET | ORAL | Status: AC
  Filled 2022-01-27: qty 2

## 2022-01-27 MED ORDER — VANCOMYCIN HCL IN DEXTROSE 1-5 GM/200ML-% IV SOLN
1000.0000 mg | INTRAVENOUS | Status: AC
  Administered 2022-01-27: 1000 mg via INTRAVENOUS

## 2022-01-27 MED ORDER — POVIDONE-IODINE 10 % EX SOLN
Freq: Once | CUTANEOUS | Status: DC
  Filled 2022-01-27: qty 473

## 2022-01-27 MED ORDER — OXYCODONE HCL 5 MG PO TABS: 5.0000 mg | ORAL_TABLET | Freq: Once | ORAL | Status: DC | PRN

## 2022-01-27 MED ORDER — ONDANSETRON HCL 4 MG PO TABS: 4.0000 mg | ORAL_TABLET | Freq: Three times a day (TID) | ORAL | 0 refills | Status: AC | PRN

## 2022-01-27 MED ORDER — TRANEXAMIC ACID-NACL 1000-0.7 MG/100ML-% IV SOLN
1000.0000 mg | INTRAVENOUS | Status: AC
  Administered 2022-01-27: 1000 mg via INTRAVENOUS

## 2022-01-27 MED ORDER — FENTANYL CITRATE (PF) 100 MCG/2ML IJ SOLN
100.0000 ug | Freq: Once | INTRAMUSCULAR | Status: AC
  Administered 2022-01-27: 100 ug via INTRAVENOUS

## 2022-01-27 MED ORDER — OXYCODONE HCL 5 MG PO TABS: ORAL_TABLET | ORAL | 0 refills | Status: AC

## 2022-01-27 MED ORDER — LACTATED RINGERS IV SOLN: INTRAVENOUS | Status: DC

## 2022-01-27 MED ORDER — MIDAZOLAM HCL 2 MG/2ML IJ SOLN
INTRAMUSCULAR | Status: AC
  Filled 2022-01-27: qty 2

## 2022-01-27 MED ORDER — GABAPENTIN 300 MG PO CAPS
ORAL_CAPSULE | ORAL | Status: AC
  Filled 2022-01-27: qty 1

## 2022-01-27 MED ORDER — ONDANSETRON HCL 4 MG/2ML IJ SOLN
INTRAMUSCULAR | Status: DC | PRN
  Administered 2022-01-27: 4 mg via INTRAVENOUS

## 2022-01-27 MED ORDER — PHENYLEPHRINE HCL (PRESSORS) 10 MG/ML IV SOLN
INTRAVENOUS | Status: AC
Start: 2022-01-27 — End: ?
  Filled 2022-01-27: qty 1

## 2022-01-27 MED ORDER — DEXAMETHASONE SODIUM PHOSPHATE 4 MG/ML IJ SOLN
INTRAMUSCULAR | Status: DC | PRN
  Administered 2022-01-27: 4 mg via INTRAVENOUS

## 2022-01-27 MED ORDER — ACETAMINOPHEN 325 MG PO TABS: 325.0000 mg | ORAL_TABLET | ORAL | Status: DC | PRN

## 2022-01-27 MED ORDER — PROPOFOL 500 MG/50ML IV EMUL
INTRAVENOUS | Status: AC
  Filled 2022-01-27: qty 50

## 2022-01-27 MED ORDER — ROCURONIUM BROMIDE 10 MG/ML (PF) SYRINGE
PREFILLED_SYRINGE | INTRAVENOUS | Status: AC
  Filled 2022-01-27: qty 10

## 2022-01-27 MED ORDER — OMEPRAZOLE 20 MG PO CPDR
20.0000 mg | DELAYED_RELEASE_CAPSULE | Freq: Every day | ORAL | 0 refills | Status: AC
Start: 2022-01-27 — End: 2022-02-26

## 2022-01-27 MED ORDER — PHENYLEPHRINE HCL-NACL 20-0.9 MG/250ML-% IV SOLN
INTRAVENOUS | Status: DC | PRN
  Administered 2022-01-27: 50 ug/min via INTRAVENOUS

## 2022-01-27 MED ORDER — GABAPENTIN 300 MG PO CAPS
300.0000 mg | ORAL_CAPSULE | Freq: Once | ORAL | Status: AC
  Administered 2022-01-27: 300 mg via ORAL

## 2022-01-27 MED ORDER — DEXAMETHASONE SODIUM PHOSPHATE 10 MG/ML IJ SOLN
INTRAMUSCULAR | Status: AC
  Filled 2022-01-27: qty 1

## 2022-01-27 MED ORDER — PHENYLEPHRINE HCL (PRESSORS) 10 MG/ML IV SOLN
INTRAVENOUS | Status: AC
  Filled 2022-01-27: qty 1

## 2022-01-27 MED ORDER — FENTANYL CITRATE (PF) 100 MCG/2ML IJ SOLN
INTRAMUSCULAR | Status: AC
  Filled 2022-01-27: qty 2

## 2022-01-27 MED ORDER — VANCOMYCIN HCL 1000 MG IV SOLR
INTRAVENOUS | Status: AC
  Filled 2022-01-27: qty 20

## 2022-01-27 MED ORDER — SUGAMMADEX SODIUM 200 MG/2ML IV SOLN
INTRAVENOUS | Status: DC | PRN
Start: 2022-01-27 — End: 2022-01-27
  Administered 2022-01-27: 125 mg via INTRAVENOUS

## 2022-01-27 MED ORDER — BUPIVACAINE LIPOSOME 1.3 % IJ SUSP
INTRAMUSCULAR | Status: DC | PRN
  Administered 2022-01-27: 10 mL via PERINEURAL

## 2022-01-27 MED ORDER — FENTANYL CITRATE (PF) 100 MCG/2ML IJ SOLN
INTRAMUSCULAR | Status: DC | PRN
  Administered 2022-01-27: 50 ug via INTRAVENOUS

## 2022-01-27 MED ORDER — ROCURONIUM BROMIDE 100 MG/10ML IV SOLN
INTRAVENOUS | Status: DC | PRN
  Administered 2022-01-27: 50 mg via INTRAVENOUS

## 2022-01-27 MED ORDER — ACETAMINOPHEN 500 MG PO TABS: 1000.0000 mg | ORAL_TABLET | Freq: Three times a day (TID) | ORAL | 0 refills | Status: AC

## 2022-01-27 MED ORDER — ACETAMINOPHEN 160 MG/5ML PO SOLN: 325.0000 mg | ORAL | Status: DC | PRN

## 2022-01-27 MED ORDER — MEPERIDINE HCL 25 MG/ML IJ SOLN: 6.2500 mg | INTRAMUSCULAR | Status: DC | PRN

## 2022-01-27 MED ORDER — VANCOMYCIN HCL IN DEXTROSE 1-5 GM/200ML-% IV SOLN
INTRAVENOUS | Status: AC
  Filled 2022-01-27: qty 200

## 2022-01-27 MED ORDER — ASPIRIN 81 MG PO CHEW: 81.0000 mg | CHEWABLE_TABLET | Freq: Two times a day (BID) | ORAL | 0 refills | Status: AC

## 2022-01-27 MED ORDER — ACETAMINOPHEN 500 MG PO TABS
1000.0000 mg | ORAL_TABLET | Freq: Once | ORAL | Status: AC
  Administered 2022-01-27: 1000 mg via ORAL

## 2022-01-27 MED ORDER — ONDANSETRON HCL 4 MG/2ML IJ SOLN
INTRAMUSCULAR | Status: AC
  Filled 2022-01-27: qty 2

## 2022-01-27 MED ORDER — BUPIVACAINE-EPINEPHRINE (PF) 0.5% -1:200000 IJ SOLN
INTRAMUSCULAR | Status: DC | PRN
  Administered 2022-01-27: 10 mL via PERINEURAL

## 2022-01-27 MED ORDER — MELOXICAM 15 MG PO TABS: 15.0000 mg | ORAL_TABLET | Freq: Every day | ORAL | 0 refills | Status: DC

## 2022-01-27 MED ORDER — OXYCODONE HCL 5 MG/5ML PO SOLN: 5.0000 mg | Freq: Once | ORAL | Status: DC | PRN

## 2022-01-27 MED ORDER — EPHEDRINE SULFATE (PRESSORS) 50 MG/ML IJ SOLN
INTRAMUSCULAR | Status: DC | PRN
  Administered 2022-01-27: 10 mg via INTRAVENOUS

## 2022-01-27 MED ORDER — PROPOFOL 10 MG/ML IV BOLUS
INTRAVENOUS | Status: DC | PRN
  Administered 2022-01-27: 100 mg via INTRAVENOUS

## 2022-01-27 MED ORDER — TRANEXAMIC ACID-NACL 1000-0.7 MG/100ML-% IV SOLN
INTRAVENOUS | Status: AC
  Filled 2022-01-27: qty 100

## 2022-01-27 MED ORDER — ONDANSETRON HCL 4 MG/2ML IJ SOLN: 4.0000 mg | Freq: Once | INTRAMUSCULAR | Status: DC | PRN

## 2022-01-27 SURGICAL SUPPLY — 67 items
BLADE SAW SAG 29X58X.64 (BLADE) ×1 IMPLANT
BLADE SAW SAG 73X25 THK (BLADE) ×1
BLADE SAW SGTL 73X25 THK (BLADE) ×1 IMPLANT
BLADE SURG 10 STRL SS (BLADE) ×1 IMPLANT
BLADE SURG 15 STRL LF DISP TIS (BLADE) IMPLANT
BLADE SURG 15 STRL SS (BLADE) ×2
CEMENT BONE DEPUY (Cement) ×2 IMPLANT
CHLORAPREP W/TINT 26 (MISCELLANEOUS) ×2 IMPLANT
COMP GLENOID AEQUALIS CL S30 (Shoulder) ×2 IMPLANT
COMPONENT GLENOD AEQLS CL S30 (Shoulder) IMPLANT
COMPONENT SZ1 NUCLEUS SHLDR (Miscellaneous) ×1 IMPLANT
COOLER ICEMAN CLASSIC (MISCELLANEOUS) ×2 IMPLANT
COVER BACK TABLE 60X90IN (DRAPES) ×2 IMPLANT
COVER MAYO STAND STRL (DRAPES) ×2 IMPLANT
DRAPE IMP U-DRAPE 54X76 (DRAPES) ×1 IMPLANT
DRAPE INCISE IOBAN 66X45 STRL (DRAPES) ×2 IMPLANT
DRAPE U-SHAPE 76X120 STRL (DRAPES) ×4 IMPLANT
DRSG AQUACEL AG ADV 3.5X 6 (GAUZE/BANDAGES/DRESSINGS) ×2 IMPLANT
ELECT BLADE 4.0 EZ CLEAN MEGAD (MISCELLANEOUS) ×2
ELECT REM PT RETURN 9FT ADLT (ELECTROSURGICAL) ×2
ELECTRODE BLDE 4.0 EZ CLN MEGD (MISCELLANEOUS) ×1 IMPLANT
ELECTRODE REM PT RTRN 9FT ADLT (ELECTROSURGICAL) ×1 IMPLANT
FACESHIELD WRAPAROUND (MASK) ×4 IMPLANT
FACESHIELD WRAPAROUND OR TEAM (MASK) ×2 IMPLANT
GLOVE SRG 8 PF TXTR STRL LF DI (GLOVE) ×1 IMPLANT
GLOVE SURG ENC MOIS LTX SZ6.5 (GLOVE) ×4 IMPLANT
GLOVE SURG LTX SZ8 (GLOVE) ×6 IMPLANT
GLOVE SURG UNDER POLY LF SZ6.5 (GLOVE) ×4 IMPLANT
GLOVE SURG UNDER POLY LF SZ8 (GLOVE) ×2
GOWN STRL REUS W/ TWL LRG LVL3 (GOWN DISPOSABLE) ×2 IMPLANT
GOWN STRL REUS W/TWL LRG LVL3 (GOWN DISPOSABLE) ×4
GOWN STRL REUS W/TWL XL LVL3 (GOWN DISPOSABLE) ×2 IMPLANT
GUIDE PIN 3X75 SHOULDER (PIN) ×2
GUIDEWIRE GLENOID 2.5X220 (WIRE) ×2 IMPLANT
HANDPIECE INTERPULSE COAX TIP (DISPOSABLE) ×2
IMPL HUMERAL HEAD 41X15 (Orthopedic Implant) IMPLANT
IMPLANT HUMERAL HEAD 41X15 (Orthopedic Implant) ×2 IMPLANT
KIT SHOULDER STAB MARCO (KITS) ×2 IMPLANT
MANIFOLD NEPTUNE II (INSTRUMENTS) ×2 IMPLANT
NDL MAYO TROCAR (NEEDLE) ×1 IMPLANT
NEEDLE MAYO TROCAR (NEEDLE) ×2 IMPLANT
NS IRRIG 1000ML POUR BTL (IV SOLUTION) ×2 IMPLANT
PACK BASIN DAY SURGERY FS (CUSTOM PROCEDURE TRAY) ×2 IMPLANT
PACK SHOULDER (CUSTOM PROCEDURE TRAY) ×2 IMPLANT
PAD COLD SHLDR WRAP-ON (PAD) ×2 IMPLANT
PENCIL SMOKE EVACUATOR (MISCELLANEOUS) IMPLANT
PIN GUIDE 3X75 SHOULDER (PIN) ×1 IMPLANT
RESTRAINT HEAD UNIVERSAL NS (MISCELLANEOUS) ×2 IMPLANT
SET HNDPC FAN SPRY TIP SCT (DISPOSABLE) ×1 IMPLANT
SHEET MEDIUM DRAPE 40X70 STRL (DRAPES) ×2 IMPLANT
SLEEVE SCD COMPRESS KNEE MED (STOCKING) ×2 IMPLANT
SLING ULTRA II SMALL (SOFTGOODS) ×1 IMPLANT
SMARTMIX MINI TOWER (MISCELLANEOUS) ×2
SPONGE T-LAP 18X18 ~~LOC~~+RFID (SPONGE) IMPLANT
STRIP CLOSURE SKIN 1/2X4 (GAUZE/BANDAGES/DRESSINGS) ×2 IMPLANT
SUT ETHIBOND 2 V 37 (SUTURE) ×2 IMPLANT
SUT ETHIBOND NAB CT1 #1 30IN (SUTURE) ×2 IMPLANT
SUT FIBERWIRE #2 38 REV NDL BL (SUTURE) ×4
SUT FIBERWIRE #5 38 CONV NDL (SUTURE) ×8
SUT MNCRL AB 4-0 PS2 18 (SUTURE) ×1 IMPLANT
SUT VIC AB 3-0 SH 27 (SUTURE) ×2
SUT VIC AB 3-0 SH 27X BRD (SUTURE) ×1 IMPLANT
SUTURE FIBERWR #5 38 CONV NDL (SUTURE) ×4 IMPLANT
SUTURE FIBERWR#2 38 REV NDL BL (SUTURE) IMPLANT
TOWEL GREEN STERILE FF (TOWEL DISPOSABLE) ×6 IMPLANT
TOWER SMARTMIX MINI (MISCELLANEOUS) ×1 IMPLANT
TUBE SUCTION HIGH CAP CLEAR NV (SUCTIONS) ×2 IMPLANT

## 2022-01-27 NOTE — Progress Notes (Signed)
Assisted Dr. Ambrose Pancoast with left, ultrasound guided, interscalene  block. Side rails up, monitors on throughout procedure. See vital signs in flow sheet. Tolerated Procedure well.

## 2022-01-27 NOTE — Discharge Instructions (Addendum)
Ophelia Charter MD, MPH Noemi Chapel, PA-C [place] [address], [address] [phone] (tel)   [phone] (fax)   [place] may leave the operative dressing in place until your follow-up appointment. KEEP THE INCISIONS CLEAN AND DRY. There may be a small amount of fluid/bleeding leaking at the surgical site. This is normal after surgery.  If it fills with liquid or blood please call us immediately to change it for you. Use the provided ice machine or Ice packs as often as possible for the first 3-4 days, then as needed for pain relief.   Keep a layer of cloth or a shirt between your skin and the cooling unit to prevent frost bite as it can get very cold.  SHOWERING: - You may shower on Post-Op Day #2.  - The dressing is water resistant but do not scrub it as it may start to peel up.   - You may remove the sling for showering, but keep a water resistant pillow under the arm to keep both the  elbow and shoulder away from the body (mimicking the abduction sling).  - Gently pat the area dry.  - Do not soak the shoulder in water. Do not go swimming in the pool or ocean until your incision has completely healed (about 4-6 weeks after surgery) - KEEP THE INCISIONS CLEAN AND DRY.  EXERCISES Wear the sling at all times You may remove the sling for showering, but keep the arm across the chest or in a secondary sling.    Accidental/Purposeful External Rotation and shoulder flexion (reaching behind you) is to be avoided at all costs for the first month. It is ok to come out of your sling if your are sitting and have assistance for eating.   Do not lift anything heavier than 1 pound until we discuss it further in clinic.  REGIONAL ANESTHESIA (NERVE BLOCKS) The anesthesia team may have performed a nerve block for you if safe in the setting of your care.  This is a great tool used to minimize pain.   Typically the block may start wearing off overnight but the long acting medicine may last for 3-4 days.  The nerve block wearing off can be a challenging period but please utilize your as needed pain medications to try and manage this period.    POST-OP MEDICATIONS- Multimodal approach to pain control In general your pain will be controlled with a combination of substances.  Prescriptions unless otherwise discussed are electronically sent to your pharmacy.  This is a carefully made plan we use to minimize narcotic use.     Meloxicam - Anti-inflammatory medication taken on a scheduled basis Acetaminophen - Non-narcotic pain medicine taken on a scheduled basis  Oxycodone - This is a strong narcotic, to be used only on an as needed basis for SEVERE pain. Aspirin 81mg  - This medicine is used to minimize the risk of blood clots after surgery. Omeprazole - daily medicine to protect your stomach while taking anti-inflammatories.   Zofran -  take as needed for nausea  FOLLOW-UP If you develop a Fever (>101.5), Redness or Drainage from the surgical incision site, please call our office to arrange for an evaluation. Please call the office to schedule a follow-up appointment for a wound check, 7-10 days post-operatively.  IF YOU HAVE ANY QUESTIONS, PLEASE FEEL FREE TO CALL OUR OFFICE.  HELPFUL INFORMATION  If you had a block, it will  wear off between 8-24 hrs postop typically.  This is period when your pain may go from nearly zero to the pain you would have had post-op without the block.  This is an abrupt transition but nothing dangerous is happening.  You may take an extra dose of narcotic when this happens.  Your arm will be in a sling following surgery. You will be in this sling for the next 4 weeks.    You may be more comfortable sleeping in a semi-seated position the first few nights following surgery.  Keep a pillow propped under the elbow and forearm for comfort.  If you have a recliner type  of chair it might be beneficial.  If not that is fine too, but it would be helpful to sleep propped up with pillows behind your operated shoulder as well under your elbow and forearm.  This will reduce pulling on the suture lines.  When dressing, put your operative arm in the sleeve first.  When getting undressed, take your operative arm out last.  Loose fitting, button-down shirts are recommended.  In most states it is against the law to drive while your arm is in a sling. And certainly against the law to drive while taking narcotics.  You may return to work/school in the next couple of days when you feel up to it. Desk work and typing in the sling is fine.  We suggest you use the pain medication the first night prior to going to bed, in order to ease any pain when the anesthesia wears off. You should avoid taking pain medications on an empty stomach as it will make you nauseous.  Do not drink alcoholic beverages or take illicit drugs when taking pain medications.  Pain medication may make you constipated.  Below are a few solutions to try in this order: Decrease the amount of pain medication if you arent having pain. Drink lots of decaffeinated fluids. Drink prune juice and/or each dried prunes  If the first 3 dont work start with additional solutions Take Colace - an over-the-counter stool softener Take Senokot - an over-the-counter laxative Take Miralax - a stronger over-the-counter laxative   Dental Antibiotics:  In most cases prophylactic antibiotics for Dental procdeures after total joint surgery are not necessary.  Exceptions are as follows:  1. History of prior total joint infection  2. Severely immunocompromised (Organ Transplant, cancer chemotherapy, Rheumatoid biologic meds such as [place])  3. Poorly controlled diabetes (A1C &gt; 8.0, blood glucose over 200)  If you have one of these conditions, contact your surgeon for an antibiotic prescription, prior to  your dental procedure.   For more information including helpful videos and documents visit our website:   [email or web address]    No Tylenol before 6:00pm if needed.   Post Anesthesia Home Care Instructions  Activity: Get plenty of rest for the remainder of the day. A responsible individual must stay with you for 24 hours following the procedure.  For the next 24 hours, DO NOT: -Drive a car -Paediatric nurse -Drink alcoholic beverages -Take any medication unless instructed by your physician -Make any legal decisions or sign important papers.  Meals: Start with liquid foods such as gelatin or soup. Progress to regular foods as tolerated. Avoid greasy, spicy, heavy foods. If nausea and/or vomiting occur, drink only clear liquids until the nausea and/or vomiting subsides. Call your physician if vomiting continues.  Special Instructions/Symptoms: Your throat may feel dry or sore from the anesthesia or the breathing tube placed in  your throat during surgery. If this causes discomfort, gargle with warm salt water. The discomfort should disappear within 24 hours.  If you had a scopolamine patch placed behind your ear for the management of post- operative nausea and/or vomiting:  1. The medication in the patch is effective for 72 hours, after which it should be removed.  Wrap patch in a tissue and discard in the trash. Wash hands thoroughly with soap and water. 2. You may remove the patch earlier than 72 hours if you experience unpleasant side effects which may include dry mouth, dizziness or visual disturbances. 3. Avoid touching the patch. Wash your hands with soap and water after contact with the patch.      Regional Anesthesia Blocks  1. Numbness or the inability to move the "blocked" extremity may last from 3-48 hours after placement. The length of time depends on the medication injected and your individual response to the medication. If the  numbness is not going away after 48 hours, call your surgeon.  2. The extremity that is blocked will need to be protected until the numbness is gone and the  Strength has returned. Because you cannot feel it, you will need to take extra care to avoid injury. Because it may be weak, you may have difficulty moving it or using it. You may not know what position it is in without looking at it while the block is in effect.  3. For blocks in the legs and feet, returning to weight bearing and walking needs to be done carefully. You will need to wait until the numbness is entirely gone and the strength has returned. You should be able to move your leg and foot normally before you try and bear weight or walk. You will need someone to be with you when you first try to ensure you do not fall and possibly risk injury.  4. Bruising and tenderness at the needle site are common side effects and will resolve in a few days.  5. Persistent numbness or new problems with movement should be communicated to the surgeon or the [facility] [phone] [place] [phone]).   Information for Discharge Teaching: EXPAREL (bupivacaine liposome injectable suspension)   Your surgeon or anesthesiologist gave you EXPAREL(bupivacaine) to help control your pain after surgery.  EXPAREL is a local anesthetic that provides pain relief by numbing the tissue around the surgical site. EXPAREL is designed to release pain medication over time and can control pain for up to 72 hours. Depending on how you respond to EXPAREL, you may require less pain medication during your recovery.  Possible side effects: Temporary loss of sensation or ability to move in the area where bupivacaine was injected. Nausea, vomiting, constipation Rarely, numbness and tingling in your mouth or lips, lightheadedness, or anxiety may occur. Call your doctor right away if you think you may be experiencing any of these  sensations, or if you have other questions regarding possible side effects.  Follow all other discharge instructions given to you by your surgeon or nurse. Eat a healthy diet and drink plenty of water or other fluids.  If you return to the hospital for any reason within 96 hours following the administration of EXPAREL, it is important for health care providers to know that you have received this anesthetic. A teal colored band has been placed on your arm with the date, time and amount of EXPAREL you have received in order to alert and inform your health care  providers. Please leave this armband in place for the full 96 hours following administration, and then you may remove the band.

## 2022-01-27 NOTE — Transfer of Care (Signed)
Immediate Anesthesia Transfer of Care Note  Patient: Jeanne Mckinney  Procedure(s) Performed: TOTAL SHOULDER ARTHROPLASTY (Left: Shoulder)  Patient Location: PACU  Anesthesia Type:General  Level of Consciousness: drowsy and patient cooperative  Airway & Oxygen Therapy: Patient Spontanous Breathing and Patient connected to face mask oxygen  Post-op Assessment: Report given to RN and Post -op Vital signs reviewed and stable  Post vital signs: Reviewed and stable  Last Vitals:  Vitals Value Taken Time  BP    Temp    Pulse 63 01/27/22 1523  Resp    SpO2 100 % 01/27/22 1523  Vitals shown include unvalidated device data.  Last Pain:  Vitals:   01/27/22 1154  TempSrc: Oral  PainSc: 4       Patients Stated Pain Goal: 2 (79/49/97 1820)  Complications: No notable events documented.

## 2022-01-27 NOTE — Anesthesia Procedure Notes (Addendum)
°  Anesthesia Regional Block: Interscalene brachial plexus block   Pre-Anesthetic Checklist: , timeout performed,  Correct Patient, Correct Site, Correct Laterality,  Correct Procedure, Correct Position, site marked,  Risks and benefits discussed,  Surgical consent,  Pre-op evaluation,  At surgeon's request and post-op pain management  Laterality: Left  Prep: chloraprep       Needles:  Injection technique: Single-shot  Needle Type: Echogenic Stimulator Needle     Needle Length: 5cm  Needle Gauge: 22     Additional Needles:   Procedures:, nerve stimulator,,, ultrasound used (permanent image in chart),,     Nerve Stimulator or Paresthesia:  Response: hand, 0.45 mA  Additional Responses:   Narrative:  Start time: 01/27/2022 1:18 PM End time: 01/27/2022 1:24 PM Injection made incrementally with aspirations every 5 mL.  Performed by: Personally  Anesthesiologist: Janeece Riggers, MD  Additional Notes: Functioning IV was confirmed and monitors were applied.  A 62mm 22ga Arrow echogenic stimulator needle was used. Sterile prep and drape,hand hygiene and sterile gloves were used. Ultrasound guidance: relevant anatomy identified, needle position confirmed, local anesthetic spread visualized around nerve(s)., vascular puncture avoided.  Image printed for medical record. Negative aspiration and negative test dose prior to incremental administration of local anesthetic. The patient tolerated the procedure well.

## 2022-01-27 NOTE — Anesthesia Preprocedure Evaluation (Addendum)
Anesthesia Evaluation  Patient identified by MRN, date of birth, ID band Patient awake    Reviewed: Allergy & Precautions, H&P , NPO status , Patient's Chart, lab work & pertinent test results, reviewed documented beta blocker date and time   Airway Mallampati: I  TM Distance: >3 FB Neck ROM: full    Dental no notable dental hx. (+) Teeth Intact, Dental Advisory Given, Caps   Pulmonary neg pulmonary ROS,    Pulmonary exam normal breath sounds clear to auscultation       Cardiovascular Exercise Tolerance: Good negative cardio ROS   Rhythm:regular Rate:Normal     Neuro/Psych negative neurological ROS  negative psych ROS   GI/Hepatic Neg liver ROS, hiatal hernia,   Endo/Other  negative endocrine ROS  Renal/GU negative Renal ROS  negative genitourinary   Musculoskeletal  (+) Arthritis , Osteoarthritis,    Abdominal   Peds  Hematology  (+) Blood dyscrasia, anemia ,   Anesthesia Other Findings   Reproductive/Obstetrics negative OB ROS                            Anesthesia Physical Anesthesia Plan  ASA: 3  Anesthesia Plan: General and Regional   Post-op Pain Management: Regional block*   Induction: Intravenous  PONV Risk Score and Plan: 3 and Ondansetron, Dexamethasone, Treatment may vary due to age or medical condition and Midazolam  Airway Management Planned: Oral ETT  Additional Equipment: None  Intra-op Plan:   Post-operative Plan: Extubation in OR  Informed Consent: I have reviewed the patients History and Physical, chart, labs and discussed the procedure including the risks, benefits and alternatives for the proposed anesthesia with the patient or authorized representative who has indicated his/her understanding and acceptance.     Dental Advisory Given  Plan Discussed with: CRNA and Anesthesiologist  Anesthesia Plan Comments: (  )        Anesthesia Quick  Evaluation

## 2022-01-27 NOTE — Anesthesia Procedure Notes (Signed)
Procedure Name: Intubation Date/Time: 01/27/2022 1:58 PM Performed by: Glory Buff, CRNA Pre-anesthesia Checklist: Patient identified, Emergency Drugs available, Suction available and Patient being monitored Patient Re-evaluated:Patient Re-evaluated prior to induction Oxygen Delivery Method: Circle system utilized Preoxygenation: Pre-oxygenation with 100% oxygen Induction Type: IV induction Ventilation: Mask ventilation without difficulty Laryngoscope Size: Miller and 3 Grade View: Grade I Tube type: Oral Tube size: 7.0 mm Number of attempts: 1 Airway Equipment and Method: Stylet and Oral airway Placement Confirmation: ETT inserted through vocal cords under direct vision, positive ETCO2 and breath sounds checked- equal and bilateral Secured at: 20 cm Tube secured with: Tape Dental Injury: Teeth and Oropharynx as per pre-operative assessment

## 2022-01-27 NOTE — H&P (Addendum)
PREOPERATIVE H&P  Chief Complaint: djd left shoulder  HPI: Jeanne Mckinney is a 76 y.o. female who is scheduled for Procedure(s): TOTAL SHOULDER ARTHROPLASTY.   Patient has a past medical history significant for HTN.   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 She has soreness every day. She has failed non-operative measures including injections, therapy, and home exercises. She feels that her shoulder is getting worse.   Symptoms are rated as moderate to severe, and have been worsening.  This is significantly impairing activities of daily living.    Please see clinic note for further details on this patient's care.    She has elected for surgical management.   Past Medical History:  Diagnosis Date   Allergic rhinitis    Alopecia    mild   Arthralgia    Arthritis    Breast cancer ([place]) 1999   left breast cancer   Hemorrhoid    Hiatal hernia    History of hypertension    Hyperlipidemia    Iron deficiency anemia    Mitral valve prolapse    Seasonal allergies    Past Surgical History:  Procedure Laterality Date   BREAST LUMPECTOMY  1999   left breast   left hip replacement  09-24-14   OTHER SURGICAL HISTORY     cyst removal buttock   right hip intraarticular injection  09-26-14   Social History   Socioeconomic History   Marital status: Married    Spouse name: Not on file   Number of children: Not on file   Years of education: Not on file   Highest education level: Not on file  Occupational History   Not on file  Tobacco Use   Smoking status: Never   Smokeless tobacco: Never  Substance and Sexual Activity   Alcohol use: No   Drug use: No   Sexual activity: Not on file  Other Topics Concern   Not on file  Social History Narrative   Not on file   Social Determinants of Health   Financial Resource Strain: Not on file  Food Insecurity: Not on file  Transportation Needs: Not on file  Physical  Activity: Not on file  Stress: Not on file  Social Connections: Not on file   Family History  Problem Relation Age of Onset   Hypertension Mother    Allergies  Allergen Reactions   Lactose Intolerance (Gi)    Prior to Admission medications   Medication Sig Start Date End Date Taking? Authorizing Provider  Biotin 1 MG CAPS Take by mouth.   Yes [provider]  finasteride (PROSCAR) 5 MG tablet Take 5 mg by mouth daily.   Yes [provider]  Fluocin-Hydroquinone-Tretinoin 0.01-4-0.05 % CREA Apply topically.   Yes [provider]  fluocinonide cream (LIDEX) 7.67 % Apply 1 application topically 2 (two) times daily.   Yes [provider]  folic acid (FOLVITE) 1 MG tablet Take 1 mg by mouth daily.   Yes [provider]  Glucosamine 500 MG CAPS Take by mouth.   Yes [provider]  magnesium 30 MG tablet Take 30 mg by mouth 2 (two) times daily.   Yes [provider]  Methylsulfonylmethane (MSM PO) Take by mouth.   Yes [provider]  saw palmetto 160 MG capsule Take 160 mg by mouth 2 (two) times daily.   Yes [provider]  TURMERIC PO Take by mouth.  Yes [provider]  VITAMIN D, CHOLECALCIFEROL, PO Take by mouth.   Yes [provider]    ROS: All other systems have been reviewed and were otherwise negative with the exception of those mentioned in the HPI and as above.  Physical Exam: General: Alert, no acute distress Cardiovascular: No pedal edema Respiratory: No cyanosis, no use of accessory musculature GI: No organomegaly, abdomen is soft and non-tender Skin: No lesions in the area of chief complaint Neurologic: Sensation intact distally Psychiatric: Patient is competent for consent with normal mood and affect Lymphatic: No axillary or cervical lymphadenopathy  MUSCULOSKELETAL:  Left shoulder: ROM to 150 degrees. External rotation to 60. Internal rotation to T10. Cuff strength  intact.   Imaging: Xrays demonstrate end stage glenohumeral arthritis without significant posterior eroision  Body mass index is 21.16 kg/m.  No results found for: ALBUMIN   Patient does not have a diagnosis of diabetes.    .         Assessment: djd left shoulder  Plan: Plan for Procedure(s): TOTAL SHOULDER ARTHROPLASTY  The risks benefits and alternatives were discussed with the patient including but not limited to the risks of nonoperative treatment, versus surgical intervention including infection, bleeding, nerve injury,  blood clots, cardiopulmonary complications, morbidity, mortality, among others, and they were willing to proceed.   We additionally specifically discussed risks of axillary nerve injury, infection, periprosthetic fracture, continued pain and longevity of implants prior to beginning procedure.    Patient will be closely monitored in PACU for medical stabilization and pain control. If found stable in PACU, patient may be discharged home with outpatient follow-up. If any concerns regarding patient's stabilization patient will be admitted for observation after surgery. The patient is planning to be discharged home with outpatient PT.   The patient acknowledged the explanation, agreed to proceed with the plan and consent was signed.   She received operative clearance from her PCP, Dr. Marlou Sa  Operative Plan: Left total shoulder arthroplasty Discharge Medications: Griffin Basil DVT Prophylaxis: Aspirin Physical Therapy: Outpatient PT Special Discharge needs: Sling. IceMan   Ethelda Chick, PA-C  01/27/2022 5:50 AM

## 2022-01-27 NOTE — Op Note (Signed)
Orthopaedic Surgery Operative Note (CSN: [id number])  KEELI ROBERG  03/25/1946 Date of Surgery: 01/27/2022   Diagnoses:  End-stage left glenohumeral arthritis  Procedure: Left anatomic total Shoulder Arthroplasty   Operative Finding Successful completion of planned procedure.  Patient's cuff was intact and had relatively concentric arthritis without significant posterior erosion.  Based on the patient's high level of function we felt that an anatomic total shoulder arthroplasty was appropriate for this patient.  Tug test was normal.  Robust subscapularis repair.  Bone quality was actually quite good.  Post-operative plan: The patient will be NWB in sling.  The patient will be will be discharged from PACU if continues to be stable as was plan prior to surgery.  DVT prophylaxis Aspirin 81 mg twice daily for 6 weeks.  Pain control with PRN pain medication preferring oral medicines.  Follow up plan will be scheduled in approximately 7 days for incision check and XR.  Physical therapy to start within a week.  Implants: Tornier size 1 nucleus with a 41 head, 30 small polyethylene  Post-Op Diagnosis: Same Surgeons:Primary: Hiram Gash, MD Assistants:Caroline McBane PA-C Location: [facility] OR ROOM 5 Anesthesia: General with Exparel Interscalene Antibiotics: Ancef 2g preop, Vancomycin 1000mg  locally Tourniquet time: None Estimated Blood Loss: 465 Complications: None Specimens: None Implants: Implant Name Type Inv. Item Serial No. Manufacturer Lot No. LRB No. Used Action  CEMENT BONE DEPUY - [id number] Cement CEMENT BONE DEPUY  DEPUY ORTHOPAEDICS [id number] Left 1 Implanted  COMP GLENOID AEQUALIS CL S30 - [id number] Shoulder COMP GLENOID AEQUALIS CL S30 [id number] [employer]  Left 1 Implanted  IMPLANT HUMERAL HEAD 41X15 - [id number] Orthopedic Implant IMPLANT HUMERAL HEAD 41X15 [id number] [employer]  Left 1 Implanted  COMPONENT SZ1 NUCLEUS [facility] - [id number] Miscellaneous COMPONENT SZ1  NUCLEUS SHLDR [id number] [employer]  Left 1 Implanted    Indications for Surgery:   BENNETTA RUDDEN is a 76 y.o. female with end-stage glenohumeral arthritis.  Benefits and risks  Tritusof operative and nonoperative management were discussed prior to surgery with patient/guardian(s) and informed consent form was completed.  Infection and need for further surgery were discussed as was prosthetic stability and cuff issues.  We additionally specifically discussed risks of axillary nerve injury, infection, periprosthetic fracture, continued pain and longevity of implants prior to beginning procedure.      Procedure:   The patient was identified in the preoperative holding area where the surgical site was marked. Block placed by anesthesia with exparel.  The patient was taken to the OR where a procedural timeout was called and the above noted anesthesia was induced.  The patient was positioned beachchair on allen table with spider arm positioner.  Preoperative antibiotics were dosed.  The patient's left shoulder was prepped and draped in the usual sterile fashion.  A second preoperative timeout was called.       Standard deltopectoral approach was performed with a #10 blade. We dissected down to the subcutaneous tissues and the cephalic vein was taken laterally with the deltoid. Clavipectoral fascia was incised in line with the incision. Deep retractors were placed. The long of the biceps tendon was identified and there was significant tenosynovitis present.  Tenodesis was performed to the pectoralis tendon with #2 Ethibond. The remaining biceps was followed up into the rotator interval where it was released. The subscapularis was taken down with a lesser tuberosity osteotomy using an osteotome. The osteotomy fragment and underlying capsular elevated off of the humeral neck and the osteophytes inferiorly. #2 Ethibond  sutures are passed through the bone tendon junction for subscap manipulation. We continued  releasing the capsule directly off of the osteophytes inferiorly all the way around the corner. This allowed [place] to dislocate the humeral head. The humeral head had evidence of severe osteoarthritic wear with full-thickness cartilage loss and exposed subchondral bone. There was significant flattening of the humeral head.   The rotator cuff was carefully examined and noted to be intact without sign of wear.  The decision was confirmed that an anatomic total shoulder was indicated for this patient.  There were osteophytes along the inferior humeral neck. The osteophytes were removed with an osteotome and a rongeur.  Osteophytes were removed with a rongeur and an osteotome and the anatomic neck was well visualized.   We next made our humeral osteotomy with an oscillating saw along the anatomic neck. The head fragment was passed off the back table and measured approximately 42 mm in diameter.   Bone quality was reasonable and we decided that it was appropriate to use simpliciti stemless implants and placed a guidepin perpendicular to our cut using a guide.  This obtained bicortical purchase.  We then reamed off of this to a flat surface and drilled and placed our nucleus.  A cut protector was placed.  The subscapularis was again identified and immediately we took care to palpate the axillary nerve anteriorly and verify its position with gentle palpation as well as the tug test.  We then released the SGHL with bovie cautery prior to placing a curved mayo at the junction of the anterior glenoid well above the axillary nerve and bluntly dissecting the subscapularis from the capsule.  We then carefully protected the axillary nerve as we gently released the inferior capsule to fully mobilize the subscapularis.  An anterior deltoid retractor was then placed as well as a small Hohmann retractor superiorly.    The glenoid was inspected and had evidence of severe osteoarthritic wear with full-thickness cartilage loss  and exposed subchondral bone. The remaining labrum was removed circumferentially taking great care not to disrupt the posterior capsule. The glenoid was sized as listed in the implants above. The center hole was marked and we used a glenoid drill guide to place a guide pin in the center position.  The glenoid was reamed concentrically over the guide pin. Next the center hole was enlarged and the drill guide for the peripheral pegs was placed. The vault was intact. The peripheral pegs were drilled in standard fashion. A trial glenoid was placed and was stable. We removed the trial components.   The glenoid bone was prepared with pulsatile lavage and a sponge to dry the bone prior to cement application. Cement was mixed on the back table the peripheral peg holes were cemented. The glenoid was impacted securely.   We turned attention back to the humeral side. The cut protector was removed. We trialed with multiple size head options and selected a 41 which re-created the patient's anatomy. The offset was dialed in to match the normal anatomy. The shoulder was trialed.  There was good ROM in all planes and the shoulder was stable with approximately 50% posterior spring back and no inferior translation.  The real humeral implants were opened on the back table and assembled.  The trial was removed. #5 Fiberwire sutures passed through the humeral neck for subscap repair. The humeral component was press-fit obtaining a secure fit.  The joint was reduced and thoroughly irrigated with pulsatile lavage. Subscap and lesser tuberosity  osteotomy repaired back in a double row fashion with #5 Fiberwire sutures through bone tunnels. Next the rotator interval was closed with #2 ethibond suture. Hemostasis was obtained. The deltopectoral interval was reapproximated with #1 Ethibond. The subcutaneous tissues were closed with 2-0 Vicryl and the skin was closed with a running monocryl. The incisions were cleaned and dried and an  Aquacel dressing was placed. The drapes taken down. The arm was placed into sling with abduction pillow. Patient was awakened, extubated, and transferred to the recovery room in stable condition. There were no intraoperative complications. The sponge, needle, and attention counts were correct at the end of the case.      Noemi Chapel, PA-C, present and scrubbed throughout the case, critical for completion in a timely fashion, and for retraction, instrumentation, closure.

## 2022-01-27 NOTE — Interval H&P Note (Signed)
All questions answered, patient wants to proceed with procedure. ? ?

## 2022-01-28 ENCOUNTER — Encounter (HOSPITAL_BASED_OUTPATIENT_CLINIC_OR_DEPARTMENT_OTHER): Payer: Self-pay | Admitting: Orthopaedic Surgery

## 2022-01-28 NOTE — Anesthesia Postprocedure Evaluation (Signed)
Anesthesia Post Note  Patient: PENNE ROSENSTOCK  Procedure(s) Performed: TOTAL SHOULDER ARTHROPLASTY (Left: Shoulder)     Patient location during evaluation: PACU Anesthesia Type: Regional and General Level of consciousness: awake and alert Pain management: pain level controlled Vital Signs Assessment: post-procedure vital signs reviewed and stable Respiratory status: spontaneous breathing, nonlabored ventilation, respiratory function stable and patient connected to nasal cannula oxygen Cardiovascular status: blood pressure returned to baseline and stable Postop Assessment: no apparent nausea or vomiting Anesthetic complications: no   No notable events documented.  Last Vitals:  Vitals:   01/27/22 1615 01/27/22 1700  BP: (!) 175/98 (!) 163/88  Pulse: (!) 58 (!) 57  Resp: 13 12  Temp:  (!) 36.3 C  SpO2: 97% 97%    Last Pain:  Vitals:   01/27/22 1700  TempSrc:   PainSc: 0-No pain                 Alexanderia Gorby

## 2022-02-02 ENCOUNTER — Other Ambulatory Visit: Payer: PRIVATE HEALTH INSURANCE

## 2022-05-11 ENCOUNTER — Other Ambulatory Visit: Payer: Self-pay | Admitting: Internal Medicine

## 2022-05-11 DIAGNOSIS — N63 Unspecified lump in unspecified breast: Secondary | ICD-10-CM

## 2022-05-12 ENCOUNTER — Encounter: Payer: Self-pay | Admitting: Oncology

## 2022-05-17 ENCOUNTER — Other Ambulatory Visit: Payer: Self-pay | Admitting: Radiology

## 2022-05-18 ENCOUNTER — Telehealth: Payer: Self-pay | Admitting: *Deleted

## 2022-05-18 NOTE — Telephone Encounter (Signed)
Call from [place] at [facility]. Patient has had imaging and recent breast biopsy that verbal report is invasive breast cancer--path report pending. Patient reports she saw Dr. Benay Spice 10 years ago and wishes to return to him. She will fax her imaging reports and they will proceed with referral to surgeon. Forwarded message to nurse navigator.

## 2022-05-19 ENCOUNTER — Encounter: Payer: Self-pay | Admitting: *Deleted

## 2022-05-19 NOTE — Progress Notes (Signed)
Received mammo/US and path report from [facility] for referral to Dr. Benay Spice for invasive cancer left breast. Records to MD desk for review. Nurse navigator has already reached out to HIM for records from [facility] system.

## 2022-05-20 ENCOUNTER — Encounter: Payer: Self-pay | Admitting: *Deleted

## 2022-05-20 NOTE — Progress Notes (Signed)
PATIENT NAVIGATOR PROGRESS NOTE  Name: Jeanne Mckinney Date: 05/20/2022 MRN: [id number]  DOB: 1946/06/14   Reason for visit:  Introductory phone call  Comments:  Spoke with [name] regarding new diagnosis, She reports that she is seeing Dr Marlou Starks, surgeon on 6/19 and will develop plan of care.  Will F/U with her after that appt for scheduling with Dr Benay Spice    Time spent counseling/coordinating care: 30-45 minutes

## 2022-06-01 ENCOUNTER — Encounter: Payer: Self-pay | Admitting: *Deleted

## 2022-06-01 NOTE — Progress Notes (Signed)
Met with Dr Toth yesterday and is awaiting call for surgery date. Discussed that she will either have lumpectomy or mastectomy and reinforced that Dr Sherrill will see her 2 weeks after surgery.  She will call with surgery date once it is scheduled 

## 2022-06-02 ENCOUNTER — Telehealth: Payer: Self-pay | Admitting: Hematology and Oncology

## 2022-06-02 ENCOUNTER — Telehealth: Payer: Self-pay | Admitting: Genetic Counselor

## 2022-06-02 NOTE — Telephone Encounter (Signed)
Scheduled appt per 6/21 referral. Pt is aware of appt date and time. Pt is aware to arrive 15 mins prior to appt time and to bring and updated insurance card. Pt is aware of appt location.   

## 2022-06-07 ENCOUNTER — Other Ambulatory Visit: Payer: Self-pay | Admitting: Radiation Oncology

## 2022-06-07 ENCOUNTER — Inpatient Hospital Stay
Admission: RE | Admit: 2022-06-07 | Discharge: 2022-06-07 | Disposition: A | Discharge status: Home / Self Care | Payer: Self-pay | Source: Ambulatory Visit | Attending: Radiation Oncology | Admitting: Radiation Oncology

## 2022-06-07 ENCOUNTER — Other Ambulatory Visit: Payer: Self-pay | Admitting: Genetic Counselor

## 2022-06-07 ENCOUNTER — Ambulatory Visit
Admission: RE | Admit: 2022-06-07 | Discharge: 2022-06-07 | Disposition: A | Discharge status: Home / Self Care | Payer: Self-pay | Source: Ambulatory Visit | Attending: Radiation Oncology | Admitting: Radiation Oncology

## 2022-06-07 DIAGNOSIS — C50912 Malignant neoplasm of unspecified site of left female breast: Secondary | ICD-10-CM

## 2022-06-07 DIAGNOSIS — Z853 Personal history of malignant neoplasm of breast: Secondary | ICD-10-CM

## 2022-06-08 ENCOUNTER — Encounter: Payer: Self-pay | Admitting: Genetic Counselor

## 2022-06-08 ENCOUNTER — Other Ambulatory Visit: Payer: Self-pay

## 2022-06-08 ENCOUNTER — Inpatient Hospital Stay: Payer: Medicare PPO

## 2022-06-08 ENCOUNTER — Inpatient Hospital Stay: Payer: Medicare PPO | Attending: Genetic Counselor | Admitting: Genetic Counselor

## 2022-06-08 ENCOUNTER — Ambulatory Visit
Admission: RE | Admit: 2022-06-08 | Discharge: 2022-06-08 | Disposition: A | Discharge status: Home / Self Care | Payer: Medicare PPO | Source: Ambulatory Visit | Attending: Radiation Oncology | Admitting: Radiation Oncology

## 2022-06-08 ENCOUNTER — Encounter: Payer: Self-pay | Admitting: Radiation Oncology

## 2022-06-08 VITALS — BP 134/87 | HR 62 | Temp 98.4°F | Wt 114.2 lb

## 2022-06-08 DIAGNOSIS — Z853 Personal history of malignant neoplasm of breast: Secondary | ICD-10-CM

## 2022-06-08 DIAGNOSIS — C50412 Malignant neoplasm of upper-outer quadrant of left female breast: Secondary | ICD-10-CM | POA: Insufficient documentation

## 2022-06-08 DIAGNOSIS — Z170 Estrogen receptor positive status [ER+]: Secondary | ICD-10-CM | POA: Insufficient documentation

## 2022-06-08 DIAGNOSIS — Z803 Family history of malignant neoplasm of breast: Secondary | ICD-10-CM

## 2022-06-08 LAB — GENETIC SCREENING ORDER

## 2022-06-11 ENCOUNTER — Ambulatory Visit: Payer: Self-pay | Admitting: General Surgery

## 2022-06-11 DIAGNOSIS — C50412 Malignant neoplasm of upper-outer quadrant of left female breast: Secondary | ICD-10-CM

## 2022-06-14 ENCOUNTER — Encounter: Payer: Self-pay | Admitting: *Deleted

## 2022-06-14 ENCOUNTER — Inpatient Hospital Stay: Payer: Medicare PPO | Admitting: Hematology and Oncology

## 2022-06-14 ENCOUNTER — Inpatient Hospital Stay: Payer: Medicare PPO

## 2022-06-14 NOTE — Progress Notes (Signed)
PATIENT NAVIGATOR PROGRESS NOTE  Name: RAYANNA MATUSIK Date: 06/14/2022 MRN: [id number]  DOB: Jun 29, 1946   Reason for visit:  Surgery date  Comments:  Spoke with Ms Fabre and she has a surgery date for lumpectomy on 7/31 so we will arrange for her to see Dr Benay Spice on 07/26/22 at 42  Verbalized understanding    Time spent counseling/coordinating care: 30-45 minutes

## 2022-06-16 ENCOUNTER — Telehealth: Payer: Self-pay | Admitting: Genetic Counselor

## 2022-06-16 NOTE — Telephone Encounter (Signed)
Revealed negative genetic testing on the BRCAPlus panel.  We are waiting on the remainder of the test results to come back.  We will call once they are available.

## 2022-06-18 ENCOUNTER — Ambulatory Visit: Payer: Self-pay | Admitting: Genetic Counselor

## 2022-06-18 ENCOUNTER — Encounter: Payer: Self-pay | Admitting: Genetic Counselor

## 2022-06-18 ENCOUNTER — Telehealth: Payer: Self-pay | Admitting: Genetic Counselor

## 2022-06-18 DIAGNOSIS — Z1379 Encounter for other screening for genetic and chromosomal anomalies: Secondary | ICD-10-CM

## 2022-06-18 NOTE — Progress Notes (Signed)
HPI:  Ms. Jeanne Mckinney was previously seen in the [facility] [place] clinic due to a personal and family history of breast cancer and concerns regarding a hereditary predisposition to cancer. Please refer to our prior cancer genetics clinic note for more information regarding our discussion, assessment and recommendations, at the time. Ms. Jeanne Mckinney recent genetic test results were disclosed to her, as were recommendations warranted by these results. These results and recommendations are discussed in more detail below.  CANCER HISTORY:  Oncology History  Malignant neoplasm of upper-outer quadrant of left female breast ([place])  06/08/2022 Initial Diagnosis   Malignant neoplasm of upper-outer quadrant of left female breast ([place])   06/16/2022 Genetic Testing   Negative genetic testing on the CancerNext-Expanded+RNAinsight panel.  The report date is June 16, 2022.  The CancerNext-Expanded gene panel offered by [facility] and includes sequencing and rearrangement analysis for the following 77 genes: AIP, ALK, APC*, ATM*, AXIN2, BAP1, BARD1, BLM, BMPR1A, BRCA1*, BRCA2*, BRIP1*, CDC73, CDH1*, CDK4, CDKN1B, CDKN2A, CHEK2*, CTNNA1, DICER1, FANCC, FH, FLCN, GALNT12, KIF1B, LZTR1, MAX, MEN1, MET, MLH1*, MSH2*, MSH3, MSH6*, MUTYH*, NBN, NF1*, NF2, NTHL1, PALB2*, PHOX2B, PMS2*, POT1, PRKAR1A, PTCH1, PTEN*, RAD51C*, RAD51D*, RB1, RECQL, RET, SDHA, SDHAF2, SDHB, SDHC, SDHD, SMAD4, SMARCA4, SMARCB1, SMARCE1, STK11, SUFU, TMEM127, TP53*, TSC1, TSC2, VHL and XRCC2 (sequencing and deletion/duplication); EGFR, EGLN1, HOXB13, KIT, MITF, PDGFRA, POLD1, and POLE (sequencing only); EPCAM and GREM1 (deletion/duplication only). DNA and RNA analyses performed for * genes.      FAMILY HISTORY:  We obtained a detailed, 4-generation family history.  Significant diagnoses are listed below: Family History  Problem Relation Age of Onset   Hypertension Mother    Breast cancer Cousin        mat first cousin   Breast cancer  Cousin        mat first cousin   Breast cancer Daughter 78       second breast cancer at 53       The patient has three daughters and two sons.  Her youngest daughter had breast cancer at 39 and 23 and has reportedly tested negative.  The patient has one full brother, three maternal half brothers and two sisters, and four paternal half brothers and two sisters.  None were reported to have cancer.  The patient's parents are deceased.   The patient's mother had 7-8 siblings who were cancer free. She has two maternal cousins who had breast cancer, and one had a daughter with colon cancer.  The maternal grandparents died of unknown causes.   The patient's father had multiple siblings, with no known cancer history.  There is no known history of cancer on the paternal side.   Ms. Jeanne Mckinney is aware of previous family history of genetic testing for hereditary cancer risks. Patient's maternal ancestors are of African American descent, and paternal ancestors are of African American descent. There is no reported Ashkenazi Jewish ancestry. There is no known consanguinity  GENETIC TEST RESULTS: Genetic testing reported out on June 16, 2022 through the CancerNext-Expanded+RNAinsight cancer panel found no pathogenic mutations. The CancerNext-Expanded gene panel offered by [facility] and includes sequencing and rearrangement analysis for the following 77 genes: AIP, ALK, APC*, ATM*, AXIN2, BAP1, BARD1, BLM, BMPR1A, BRCA1*, BRCA2*, BRIP1*, CDC73, CDH1*, CDK4, CDKN1B, CDKN2A, CHEK2*, CTNNA1, DICER1, FANCC, FH, FLCN, GALNT12, KIF1B, LZTR1, MAX, MEN1, MET, MLH1*, MSH2*, MSH3, MSH6*, MUTYH*, NBN, NF1*, NF2, NTHL1, PALB2*, PHOX2B, PMS2*, POT1, PRKAR1A, PTCH1, PTEN*, RAD51C*, RAD51D*, RB1, RECQL, RET, SDHA, SDHAF2, SDHB, SDHC, SDHD, SMAD4,  SMARCA4, SMARCB1, SMARCE1, STK11, SUFU, TMEM127, TP53*, TSC1, TSC2, VHL and XRCC2 (sequencing and deletion/duplication); EGFR, EGLN1, HOXB13, KIT, MITF, PDGFRA, POLD1, and POLE  (sequencing only); EPCAM and GREM1 (deletion/duplication only). DNA and RNA analyses performed for * genes. The test report has been scanned into EPIC and is located under the Molecular Pathology section of the Results Review tab.  A portion of the result report is included below for reference.     We discussed with Ms. Jeanne Mckinney that because current genetic testing is not perfect, it is possible there may be a gene mutation in one of these genes that current testing cannot detect, but that chance is small.  We also discussed, that there could be another gene that has not yet been discovered, or that we have not yet tested, that is responsible for the cancer diagnoses in the family. It is also possible there is a hereditary cause for the cancer in the family that Ms. Jeanne Mckinney did not inherit and therefore was not identified in her testing.  Therefore, it is important to remain in touch with cancer genetics in the future so that we can continue to offer Ms. Bayne the most up to date genetic testing.   ADDITIONAL GENETIC TESTING: We discussed with Ms. Jeanne Mckinney that her genetic testing was fairly extensive.  If there are genes identified to increase cancer risk that can be analyzed in the future, we would be happy to discuss and coordinate this testing at that time.    CANCER SCREENING RECOMMENDATIONS: Ms. Jeanne Mckinney test result is considered negative (normal).  This means that we have not identified a hereditary cause for her personal and family history of breast cancer at this time. Most cancers happen by chance and this negative test suggests that her cancer may fall into this category.    While reassuring, this does not definitively rule out a hereditary predisposition to cancer. It is still possible that there could be genetic mutations that are undetectable by current technology. There could be genetic mutations in genes that have not been tested or identified to increase cancer risk.  Therefore, it is  recommended she continue to follow the cancer management and screening guidelines provided by her oncology and primary healthcare provider.   An individual's cancer risk and medical management are not determined by genetic test results alone. Overall cancer risk assessment incorporates additional factors, including personal medical history, family history, and any available genetic information that may result in a personalized plan for cancer prevention and surveillance  RECOMMENDATIONS FOR FAMILY MEMBERS:  Individuals in this family might be at some increased risk of developing cancer, over the general population risk, simply due to the family history of cancer.  We recommended women in this family have a yearly mammogram beginning at age 1, or 35 years younger than the earliest onset of cancer, an annual clinical breast exam, and perform monthly breast self-exams. Women in this family should also have a gynecological exam as recommended by their primary provider. All family members should be referred for colonoscopy starting at age 39.  FOLLOW-UP: Lastly, we discussed with Ms. Jeanne Mckinney that cancer genetics is a rapidly advancing field and it is possible that new genetic tests will be appropriate for her and/or her family members in the future. We encouraged her to remain in contact with cancer genetics on an annual basis so we can update her personal and family histories and let her know of advances in cancer genetics that may benefit this family.  Our contact number was provided. Ms. Jeanne Mckinney questions were answered to her satisfaction, and she knows she is welcome to call us at anytime with additional questions or concerns.   Roma Kayser, [place], [facility] Licensed, Certified Genetic Counselor [email or web address]

## 2022-06-18 NOTE — Telephone Encounter (Signed)
Revealed negative genetic testing.  Discussed that we do not know why she has breast cancer or why there is cancer in the family. It could be due to a different gene that we are not testing, or maybe our current technology may not be able to pick something up.  It will be important for her to keep in contact with genetics to keep up with whether additional testing may be needed. 

## 2022-07-06 ENCOUNTER — Other Ambulatory Visit: Payer: Self-pay

## 2022-07-06 ENCOUNTER — Encounter (HOSPITAL_BASED_OUTPATIENT_CLINIC_OR_DEPARTMENT_OTHER): Payer: Self-pay | Admitting: General Surgery

## 2022-07-08 NOTE — Progress Notes (Signed)
       Patient Instructions  The night before surgery:  No food after midnight. ONLY clear liquids after midnight  The day of surgery (if you do NOT have diabetes):  Drink ONE (1) Pre-Surgery Clear Ensure as directed.   This drink was given to you during your hospital  pre-op appointment visit. The pre-op nurse will instruct you on the time to drink the  Pre-Surgery Ensure depending on your surgery time. Finish the drink at the designated time by the pre-op nurse.  Nothing else to drink after completing the  Pre-Surgery Clear Ensure.  The day of surgery (if you have diabetes): Drink ONE (1) Gatorade 2 (G2) as directed. This drink was given to you during your hospital  pre-op appointment visit.  The pre-op nurse will instruct you on the time to drink the   Gatorade 2 (G2) depending on your surgery time. Color of the Gatorade may vary. Red is not allowed. Nothing else to drink after completing the  Gatorade 2 (G2).         If you have questions, please contact your surgeon's office. Surgical soap given to patient with instructions and patient verbalized understanding.  

## 2022-07-12 ENCOUNTER — Encounter (HOSPITAL_BASED_OUTPATIENT_CLINIC_OR_DEPARTMENT_OTHER)
Admission: RE | Disposition: A | Discharge status: Home / Self Care | Payer: Self-pay | Source: Ambulatory Visit | Attending: General Surgery

## 2022-07-12 ENCOUNTER — Encounter (HOSPITAL_BASED_OUTPATIENT_CLINIC_OR_DEPARTMENT_OTHER): Payer: Self-pay | Admitting: General Surgery

## 2022-07-12 ENCOUNTER — Ambulatory Visit (HOSPITAL_BASED_OUTPATIENT_CLINIC_OR_DEPARTMENT_OTHER): Payer: Medicare PPO | Admitting: Anesthesiology

## 2022-07-12 ENCOUNTER — Ambulatory Visit (HOSPITAL_BASED_OUTPATIENT_CLINIC_OR_DEPARTMENT_OTHER)
Admission: RE | Admit: 2022-07-12 | Discharge: 2022-07-12 | Disposition: A | Discharge status: Home / Self Care | Payer: Medicare PPO | Source: Ambulatory Visit | Attending: General Surgery | Admitting: General Surgery

## 2022-07-12 ENCOUNTER — Other Ambulatory Visit: Payer: Self-pay

## 2022-07-12 DIAGNOSIS — Z853 Personal history of malignant neoplasm of breast: Secondary | ICD-10-CM | POA: Diagnosis not present

## 2022-07-12 DIAGNOSIS — M1990 Unspecified osteoarthritis, unspecified site: Secondary | ICD-10-CM | POA: Diagnosis not present

## 2022-07-12 DIAGNOSIS — C50412 Malignant neoplasm of upper-outer quadrant of left female breast: Secondary | ICD-10-CM | POA: Insufficient documentation

## 2022-07-12 DIAGNOSIS — Z923 Personal history of irradiation: Secondary | ICD-10-CM | POA: Insufficient documentation

## 2022-07-12 DIAGNOSIS — Z803 Family history of malignant neoplasm of breast: Secondary | ICD-10-CM | POA: Diagnosis not present

## 2022-07-12 DIAGNOSIS — C50912 Malignant neoplasm of unspecified site of left female breast: Secondary | ICD-10-CM | POA: Diagnosis not present

## 2022-07-12 DIAGNOSIS — Z170 Estrogen receptor positive status [ER+]: Secondary | ICD-10-CM | POA: Diagnosis not present

## 2022-07-12 DIAGNOSIS — D649 Anemia, unspecified: Secondary | ICD-10-CM | POA: Diagnosis not present

## 2022-07-12 DIAGNOSIS — Z01818 Encounter for other preprocedural examination: Secondary | ICD-10-CM

## 2022-07-12 HISTORY — PX: BREAST LUMPECTOMY WITH RADIOACTIVE SEED AND SENTINEL LYMPH NODE BIOPSY: SHX6550

## 2022-07-12 SURGERY — BREAST LUMPECTOMY WITH RADIOACTIVE SEED AND SENTINEL LYMPH NODE BIOPSY
Anesthesia: Regional | Site: Breast | Laterality: Left

## 2022-07-12 MED ORDER — LIDOCAINE HCL 1 % IJ SOLN
INTRAMUSCULAR | Status: DC | PRN
  Administered 2022-07-12: 60 mg via INTRADERMAL

## 2022-07-12 MED ORDER — DEXAMETHASONE SODIUM PHOSPHATE 10 MG/ML IJ SOLN
INTRAMUSCULAR | Status: DC | PRN
  Administered 2022-07-12: 5 mg via INTRAVENOUS

## 2022-07-12 MED ORDER — MIDAZOLAM HCL 2 MG/2ML IJ SOLN
INTRAMUSCULAR | Status: AC
  Filled 2022-07-12: qty 2

## 2022-07-12 MED ORDER — ROPIVACAINE HCL 5 MG/ML IJ SOLN
INTRAMUSCULAR | Status: DC | PRN
  Administered 2022-07-12: 30 mL

## 2022-07-12 MED ORDER — LACTATED RINGERS IV SOLN: INTRAVENOUS | Status: DC

## 2022-07-12 MED ORDER — CELECOXIB 200 MG PO CAPS
200.0000 mg | ORAL_CAPSULE | ORAL | Status: AC
  Administered 2022-07-12: 200 mg via ORAL

## 2022-07-12 MED ORDER — LIDOCAINE 2% (20 MG/ML) 5 ML SYRINGE
INTRAMUSCULAR | Status: AC
  Filled 2022-07-12: qty 5

## 2022-07-12 MED ORDER — CEFAZOLIN SODIUM-DEXTROSE 2-4 GM/100ML-% IV SOLN
2.0000 g | INTRAVENOUS | Status: AC
  Administered 2022-07-12: 2 g via INTRAVENOUS

## 2022-07-12 MED ORDER — GABAPENTIN 300 MG PO CAPS
ORAL_CAPSULE | ORAL | Status: AC
  Filled 2022-07-12: qty 1

## 2022-07-12 MED ORDER — FENTANYL CITRATE (PF) 100 MCG/2ML IJ SOLN
INTRAMUSCULAR | Status: AC
  Filled 2022-07-12: qty 2

## 2022-07-12 MED ORDER — CELECOXIB 200 MG PO CAPS
ORAL_CAPSULE | ORAL | Status: AC
  Filled 2022-07-12: qty 1

## 2022-07-12 MED ORDER — BUPIVACAINE-EPINEPHRINE 0.25% -1:200000 IJ SOLN
INTRAMUSCULAR | Status: DC | PRN
  Administered 2022-07-12: 17 mL

## 2022-07-12 MED ORDER — LIDOCAINE 2% (20 MG/ML) 5 ML SYRINGE
INTRAMUSCULAR | Status: AC
  Filled 2022-07-12: qty 10

## 2022-07-12 MED ORDER — ONDANSETRON HCL 4 MG/2ML IJ SOLN
INTRAMUSCULAR | Status: DC | PRN
  Administered 2022-07-12: 4 mg via INTRAVENOUS

## 2022-07-12 MED ORDER — PROPOFOL 10 MG/ML IV BOLUS
INTRAVENOUS | Status: DC | PRN
  Administered 2022-07-12: 110 mg via INTRAVENOUS

## 2022-07-12 MED ORDER — PROPOFOL 10 MG/ML IV BOLUS
INTRAVENOUS | Status: AC
  Filled 2022-07-12: qty 20

## 2022-07-12 MED ORDER — EPHEDRINE 5 MG/ML INJ
INTRAVENOUS | Status: AC
  Filled 2022-07-12: qty 15

## 2022-07-12 MED ORDER — MIDAZOLAM HCL 2 MG/2ML IJ SOLN
1.0000 mg | Freq: Once | INTRAMUSCULAR | Status: AC
  Administered 2022-07-12: 1 mg via INTRAVENOUS

## 2022-07-12 MED ORDER — CEFAZOLIN SODIUM-DEXTROSE 2-4 GM/100ML-% IV SOLN
INTRAVENOUS | Status: AC
  Filled 2022-07-12: qty 100

## 2022-07-12 MED ORDER — FENTANYL CITRATE (PF) 100 MCG/2ML IJ SOLN
INTRAMUSCULAR | Status: DC | PRN
  Administered 2022-07-12: 25 ug via INTRAVENOUS

## 2022-07-12 MED ORDER — PHENYLEPHRINE 80 MCG/ML (10ML) SYRINGE FOR IV PUSH (FOR BLOOD PRESSURE SUPPORT)
PREFILLED_SYRINGE | INTRAVENOUS | Status: AC
  Filled 2022-07-12: qty 10

## 2022-07-12 MED ORDER — CHLORHEXIDINE GLUCONATE CLOTH 2 % EX PADS: 6.0000 | MEDICATED_PAD | Freq: Once | CUTANEOUS | Status: DC

## 2022-07-12 MED ORDER — GLYCOPYRROLATE 0.2 MG/ML IJ SOLN
INTRAMUSCULAR | Status: DC | PRN
  Administered 2022-07-12: .2 mg via INTRAVENOUS

## 2022-07-12 MED ORDER — ACETAMINOPHEN 500 MG PO TABS
ORAL_TABLET | ORAL | Status: AC
  Filled 2022-07-12: qty 2

## 2022-07-12 MED ORDER — ACETAMINOPHEN 500 MG PO TABS
1000.0000 mg | ORAL_TABLET | ORAL | Status: AC
  Administered 2022-07-12: 1000 mg via ORAL

## 2022-07-12 MED ORDER — CLONIDINE HCL (ANALGESIA) 100 MCG/ML EP SOLN
EPIDURAL | Status: DC | PRN
  Administered 2022-07-12: 80 ug

## 2022-07-12 MED ORDER — FENTANYL CITRATE (PF) 100 MCG/2ML IJ SOLN: 25.0000 ug | INTRAMUSCULAR | Status: DC | PRN

## 2022-07-12 MED ORDER — OXYCODONE HCL 5 MG PO TABS: 5.0000 mg | ORAL_TABLET | Freq: Once | ORAL | Status: DC | PRN

## 2022-07-12 MED ORDER — MAGTRACE LYMPHATIC TRACER
INTRAMUSCULAR | Status: DC | PRN
  Administered 2022-07-12: 2 mL via INTRAMUSCULAR

## 2022-07-12 MED ORDER — DEXAMETHASONE SODIUM PHOSPHATE 4 MG/ML IJ SOLN
INTRAMUSCULAR | Status: DC | PRN
  Administered 2022-07-12: 4 mg

## 2022-07-12 MED ORDER — FENTANYL CITRATE (PF) 100 MCG/2ML IJ SOLN
50.0000 ug | Freq: Once | INTRAMUSCULAR | Status: AC
  Administered 2022-07-12: 50 ug via INTRAVENOUS

## 2022-07-12 MED ORDER — EPHEDRINE 5 MG/ML INJ
INTRAVENOUS | Status: AC
  Filled 2022-07-12: qty 5

## 2022-07-12 MED ORDER — DEXAMETHASONE SODIUM PHOSPHATE 10 MG/ML IJ SOLN
INTRAMUSCULAR | Status: AC
  Filled 2022-07-12: qty 1

## 2022-07-12 MED ORDER — GABAPENTIN 300 MG PO CAPS
300.0000 mg | ORAL_CAPSULE | ORAL | Status: AC
  Administered 2022-07-12: 300 mg via ORAL

## 2022-07-12 MED ORDER — PROPOFOL 500 MG/50ML IV EMUL
INTRAVENOUS | Status: AC
  Filled 2022-07-12: qty 100

## 2022-07-12 MED ORDER — EPHEDRINE SULFATE (PRESSORS) 50 MG/ML IJ SOLN
INTRAMUSCULAR | Status: DC | PRN
  Administered 2022-07-12 (×2): 7 mg via INTRAVENOUS
  Administered 2022-07-12: 5 mg via INTRAVENOUS

## 2022-07-12 MED ORDER — OXYCODONE HCL 5 MG/5ML PO SOLN: 5.0000 mg | Freq: Once | ORAL | Status: DC | PRN

## 2022-07-12 MED ORDER — OXYCODONE HCL 5 MG PO TABS: 5.0000 mg | ORAL_TABLET | Freq: Four times a day (QID) | ORAL | 0 refills | Status: DC | PRN

## 2022-07-12 MED ORDER — AMISULPRIDE (ANTIEMETIC) 5 MG/2ML IV SOLN: 10.0000 mg | Freq: Once | INTRAVENOUS | Status: DC | PRN

## 2022-07-12 MED ORDER — ONDANSETRON HCL 4 MG/2ML IJ SOLN
INTRAMUSCULAR | Status: AC
  Filled 2022-07-12: qty 8

## 2022-07-12 SURGICAL SUPPLY — 43 items
ADH SKN CLS APL DERMABOND .7 (GAUZE/BANDAGES/DRESSINGS) ×1
APL PRP STRL LF DISP 70% ISPRP (MISCELLANEOUS) ×1
APPLIER CLIP 9.375 MED OPEN (MISCELLANEOUS) ×2
APR CLP MED 9.3 20 MLT OPN (MISCELLANEOUS) ×1
BINDER BREAST MEDIUM (GAUZE/BANDAGES/DRESSINGS) ×1 IMPLANT
BLADE SURG 15 STRL LF DISP TIS (BLADE) ×1 IMPLANT
BLADE SURG 15 STRL SS (BLADE) ×2
CANISTER SUC SOCK COL 7IN (MISCELLANEOUS) IMPLANT
CANISTER SUCT 1200ML W/VALVE (MISCELLANEOUS) IMPLANT
CHLORAPREP W/TINT 26 (MISCELLANEOUS) ×2 IMPLANT
CLIP APPLIE 9.375 MED OPEN (MISCELLANEOUS) ×1 IMPLANT
COVER BACK TABLE 60X90IN (DRAPES) ×2 IMPLANT
COVER MAYO STAND STRL (DRAPES) ×2 IMPLANT
COVER PROBE W GEL 5X96 (DRAPES) ×2 IMPLANT
DERMABOND ADVANCED (GAUZE/BANDAGES/DRESSINGS) ×1
DERMABOND ADVANCED .7 DNX12 (GAUZE/BANDAGES/DRESSINGS) ×1 IMPLANT
DRAPE LAPAROSCOPIC ABDOMINAL (DRAPES) ×2 IMPLANT
DRAPE UTILITY XL STRL (DRAPES) ×2 IMPLANT
ELECT COATED BLADE 2.86 ST (ELECTRODE) ×2 IMPLANT
ELECT REM PT RETURN 9FT ADLT (ELECTROSURGICAL) ×2
ELECTRODE REM PT RTRN 9FT ADLT (ELECTROSURGICAL) ×1 IMPLANT
GLOVE BIO SURGEON STRL SZ7.5 (GLOVE) ×2 IMPLANT
GLOVE BIOGEL PI IND STRL 7.0 (GLOVE) IMPLANT
GLOVE BIOGEL PI INDICATOR 7.0 (GLOVE) ×1
GOWN STRL REUS W/ TWL LRG LVL3 (GOWN DISPOSABLE) ×2 IMPLANT
GOWN STRL REUS W/TWL LRG LVL3 (GOWN DISPOSABLE) ×4
KIT MARKER MARGIN INK (KITS) ×2 IMPLANT
NDL HYPO 25X1 1.5 SAFETY (NEEDLE) ×1 IMPLANT
NEEDLE HYPO 25X1 1.5 SAFETY (NEEDLE) ×4 IMPLANT
NS IRRIG 1000ML POUR BTL (IV SOLUTION) IMPLANT
PACK BASIN DAY SURGERY FS (CUSTOM PROCEDURE TRAY) ×2 IMPLANT
PENCIL SMOKE EVACUATOR (MISCELLANEOUS) ×2 IMPLANT
SLEEVE SCD COMPRESS KNEE MED (STOCKING) ×2 IMPLANT
SPONGE T-LAP 18X18 ~~LOC~~+RFID (SPONGE) ×2 IMPLANT
SUT MON AB 4-0 PC3 18 (SUTURE) ×3 IMPLANT
SUT SILK 2 0 SH (SUTURE) IMPLANT
SUT VICRYL 3-0 CR8 SH (SUTURE) ×2 IMPLANT
SYR CONTROL 10ML LL (SYRINGE) ×2 IMPLANT
TOWEL GREEN STERILE FF (TOWEL DISPOSABLE) ×2 IMPLANT
TRACER MAGTRACE VIAL (MISCELLANEOUS) ×1 IMPLANT
TRAY FAXITRON CT DISP (TRAY / TRAY PROCEDURE) ×2 IMPLANT
TUBE CONNECTING 20X1/4 (TUBING) ×1 IMPLANT
YANKAUER SUCT BULB TIP NO VENT (SUCTIONS) ×1 IMPLANT

## 2022-07-12 NOTE — Anesthesia Preprocedure Evaluation (Signed)
Anesthesia Evaluation  Patient identified by MRN, date of birth, ID band Patient awake    Reviewed: Allergy & Precautions, H&P , NPO status , Patient's Chart, lab work & pertinent test results, reviewed documented beta blocker date and time   Airway Mallampati: I  TM Distance: >3 FB Neck ROM: full    Dental  (+) Teeth Intact, Dental Advisory Given, Caps   Pulmonary neg pulmonary ROS,    Pulmonary exam normal breath sounds clear to auscultation       Cardiovascular Exercise Tolerance: Good negative cardio ROS   Rhythm:regular Rate:Normal     Neuro/Psych negative neurological ROS  negative psych ROS   GI/Hepatic Neg liver ROS, hiatal hernia,   Endo/Other  negative endocrine ROS  Renal/GU negative Renal ROS  negative genitourinary   Musculoskeletal  (+) Arthritis , Osteoarthritis,    Abdominal   Peds  Hematology  (+) Blood dyscrasia, anemia ,   Anesthesia Other Findings   Reproductive/Obstetrics negative OB ROS                             Anesthesia Physical  Anesthesia Plan  ASA: 3  Anesthesia Plan: General and Regional   Post-op Pain Management: Regional block*, Tylenol PO (pre-op)* and Celebrex PO (pre-op)*   Induction: Intravenous  PONV Risk Score and Plan: 3 and Ondansetron, Dexamethasone, Treatment may vary due to age or medical condition and Midazolam  Airway Management Planned: LMA  Additional Equipment: None  Intra-op Plan:   Post-operative Plan: Extubation in OR  Informed Consent: I have reviewed the patients History and Physical, chart, labs and discussed the procedure including the risks, benefits and alternatives for the proposed anesthesia with the patient or authorized representative who has indicated his/her understanding and acceptance.     Dental Advisory Given  Plan Discussed with: CRNA  Anesthesia Plan Comments: (  )        Anesthesia Quick  Evaluation

## 2022-07-12 NOTE — Anesthesia Procedure Notes (Signed)
Procedure Name: LMA Insertion Date/Time: 07/12/2022 10:07 AM  Performed by: Garrel Ridgel, CRNAPre-anesthesia Checklist: Patient identified, Emergency Drugs available, Suction available and Patient being monitored Patient Re-evaluated:Patient Re-evaluated prior to induction Oxygen Delivery Method: Circle system utilized Preoxygenation: Pre-oxygenation with 100% oxygen Induction Type: IV induction Ventilation: Mask ventilation without difficulty LMA: LMA inserted LMA Size: 3.0 Number of attempts: 1 Placement Confirmation: positive ETCO2 Tube secured with: Tape Dental Injury: Teeth and Oropharynx as per pre-operative assessment

## 2022-07-12 NOTE — Progress Notes (Signed)
Assisted Dr. Germeroth with left, pectoralis, ultrasound guided block. Side rails up, monitors on throughout procedure. See vital signs in flow sheet. Tolerated Procedure well. 

## 2022-07-12 NOTE — Op Note (Signed)
07/12/2022  11:15 AM  PATIENT:  Jeanne Mckinney  76 y.o. female  PRE-OPERATIVE DIAGNOSIS:  LEFT BREAST CANCER  POST-OPERATIVE DIAGNOSIS:  LEFT BREAST CANCER  PROCEDURE:  Procedure(s): LEFT BREAST LUMPECTOMY WITH RADIOACTIVE SEED LOCALIZATION AND DEEP LEFT AXILLARY SENTINEL NODE BIOPSY (Left)  SURGEON:  Surgeon(s) and Role:    * Jovita Kussmaul, MD - Primary  PHYSICIAN ASSISTANT:   ASSISTANTS: none   ANESTHESIA:   local and general  EBL:  minimal   BLOOD ADMINISTERED:none  DRAINS: none   LOCAL MEDICATIONS USED:  MARCAINE     SPECIMEN:  Source of Specimen:  left breast tissue and sentinel nodes x 2  DISPOSITION OF SPECIMEN:  PATHOLOGY  COUNTS:  YES  TOURNIQUET:  * No tourniquets in log *  DICTATION: .Dragon Dictation  After informed consent was obtained the patient was brought to the operating room and placed in the supine position on the operating table.  After adequate induction of general anesthesia the patient's left chest, breast, and axillary area were prepped with ChloraPrep, allowed to dry, and draped in usual sterile manner.  An appropriate timeout was performed.  At this point, 2 cc of iron oxide were injected in the subareolar plexus of the left breast and the breast was massaged for 5 minutes.  Previously an I-125 seed was placed in the upper portion of the left breast to mark an area of invasive breast cancer.  The neoprobe was set to I-125 in the area of radioactivity was readily identified.  The area around this was infiltrated with quarter percent Marcaine.  A curvilinear incision was made along the upper edge of the areola with a 15 blade knife of the left breast.  The incision was carried through the skin and subcutaneous tissue sharply with the electrocautery.  Dissection was then carried throughout the upper portion of the breast between the breast tissue and subcutaneous fat and skin until the dissection was well beyond the area of the cancer.  I then removed  a circular portion of breast tissue sharply with the electrocautery around the radioactive seed while checking the area of radioactivity frequently.  Once the specimen was removed it was oriented with the appropriate paint colors.  A specimen radiograph was obtained that showed the clip and seed to be near the center of the specimen.  The specimen was then sent to pathology for further evaluation.  Hemostasis was achieved using the Bovie electrocautery.  The wound was irrigated with saline and infiltrated with more quarter percent Marcaine.  The deep layer of the incision was closed with interrupted 3-0 Vicryl stitches.  The skin was then closed with interrupted 4-0 Monocryl subcuticular stitches.  Attention was then turned to the left axilla.  The mag trace was used to identify a signal in the left axilla.  This area was infiltrated with quarter percent Marcaine.  Small transversely oriented incision was made with a 15 blade knife overlying the area of signal.  The incision was carried through the skin and subcutaneous tissue sharply with the electrocautery until the deep left axillary space was entered.  Using the mag trace I was able to identify 2 small lymph nodes.  These were excised sharply with the electrocautery and the surrounding small vessels and lymphatics were controlled with clips.  Both of these nodes had signal.  They were sent as sentinel nodes numbers 1 and 2.  No other hot or palpable nodes were identified in the left axilla.  Hemostasis was achieved using  the Bovie electrocautery.  The deep layer of the incision was closed with interrupted 3-0 Vicryl stitches.  The skin was closed with a running 4-0 Monocryl subcuticular stitch.  Dermabond dressings were applied.  The patient tolerated the procedure well.  At the end of the case all needle sponge and instrument counts were correct.  The patient was then awakened and taken to recovery in stable condition.  PLAN OF CARE: Discharge to home after  PACU  PATIENT DISPOSITION:  PACU - hemodynamically stable.   Delay start of Pharmacological VTE agent (>24hrs) due to surgical blood loss or risk of bleeding: not applicable

## 2022-07-12 NOTE — Interval H&P Note (Signed)
History and Physical Interval Note:  07/12/2022 9:43 AM  Jeanne Mckinney  has presented today for surgery, with the diagnosis of LEFT BREAST CANCER.  The various methods of treatment have been discussed with the patient and family. After consideration of risks, benefits and other options for treatment, the patient has consented to  Procedure(s) with comments: LEFT BREAST LUMPECTOMY WITH RADIOACTIVE SEED AND SENTINEL NODE BIOPSY (Left) - GEN & PEC BLOCK as a surgical intervention.  The patient's history has been reviewed, patient examined, no change in status, stable for surgery.  I have reviewed the patient's chart and labs.  Questions were answered to the patient's satisfaction.     Autumn Messing III

## 2022-07-12 NOTE — Anesthesia Procedure Notes (Signed)
Anesthesia Regional Block: Pectoralis block   Pre-Anesthetic Checklist: , timeout performed,  Correct Patient, Correct Site, Correct Laterality,  Correct Procedure, Correct Position, site marked,  Risks and benefits discussed,  Surgical consent,  Pre-op evaluation,  At surgeon's request and post-op pain management  Laterality: Left  Prep: chloraprep       Needles:   Needle Type: Stimiplex     Needle Length: 9cm      Additional Needles:   Procedures:,,,, ultrasound used (permanent image in chart),,    Narrative:  Start time: 07/12/2022 8:21 AM End time: 07/12/2022 8:41 AM Injection made incrementally with aspirations every 5 mL.  Performed by: Personally  Anesthesiologist: Nolon Nations, MD  Additional Notes: BP cuff, SpO2 and EKG monitors applied. Sedation begun.  Anesthetic injected incrementally, slowly, and after neg aspirations under direct ultrasound guidance. Good fascial spread noted. Patient tolerated well.

## 2022-07-12 NOTE — Discharge Instructions (Addendum)
  Post Anesthesia Home Care Instructions  Activity: Get plenty of rest for the remainder of the day. A responsible individual must stay with you for 24 hours following the procedure.  For the next 24 hours, DO NOT: -Drive a car -Paediatric nurse -Drink alcoholic beverages -Take any medication unless instructed by your physician -Make any legal decisions or sign important papers.  Meals: Start with liquid foods such as gelatin or soup. Progress to regular foods as tolerated. Avoid greasy, spicy, heavy foods. If nausea and/or vomiting occur, drink only clear liquids until the nausea and/or vomiting subsides. Call your physician if vomiting continues.  Special Instructions/Symptoms: Your throat may feel dry or sore from the anesthesia or the breathing tube placed in your throat during surgery. If this causes discomfort, gargle with warm salt water. The discomfort should disappear within 24 hours.  If you had a scopolamine patch placed behind your ear for the management of post- operative nausea and/or vomiting:  1. The medication in the patch is effective for 72 hours, after which it should be removed.  Wrap patch in a tissue and discard in the trash. Wash hands thoroughly with soap and water. 2. You may remove the patch earlier than 72 hours if you experience unpleasant side effects which may include dry mouth, dizziness or visual disturbances. 3. Avoid touching the patch. Wash your hands with soap and water after contact with the patch.      Next dose of Tylenol can be given at 2:10pm if needed. Next dose of NSAID (Ibuprofen/Motrin/Aleve) can be given at 4:10pm if needed.

## 2022-07-12 NOTE — H&P (Signed)
REFERRING PHYSICIAN: Allyson Sabal, *  PROVIDER: Landry Corporal, MD  MRN: [id number] DOB: [birth year] Subjective   Chief Complaint: Breast Cancer   History of Present Illness: Jeanne Mckinney is a 76 y.o. female who is seen today as an office consultation for evaluation of Breast Cancer .   We are asked to see the patient in consultation by Dr. Emmit Pomfret to evaluate her for a new left breast cancer. The patient is a 76 year old black female who recently went for a routine screening mammogram. At that time she was found to have a 1.1 cm mass in the upper outer quadrant of the left breast. The axilla looked negative. 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 She does have a history of previous left breast cancer in 1999 that was treated with lumpectomy and radiation. She also has a daughter who was diagnosed with breast cancer at the age of 59. She is unaware of any genetic testing.  Review of Systems: A complete review of systems was obtained from the patient. I have reviewed this information and discussed as appropriate with the patient. See HPI as well for other ROS.  ROS   Medical History: Past Medical History:  Diagnosis Date  Anemia  Arthritis  History of cancer  Hyperlipidemia   Patient Active Problem List  Diagnosis  Malignant neoplasm of upper-outer quadrant of left breast in female, estrogen receptor positive (CMS-HCC)   Past Surgical History:  Procedure Laterality Date  MASTECTOMY PARTIAL / LUMPECTOMY 1999  hip replacement surgery Right 09/24/2013  left hip replacement surgery 12/17/2013  ARTHROPLASTY TOTAL SHOULDER Left 01/27/2022  Dr. Adele Dan    Allergies  Allergen Reactions  Lactose Other (See Comments)  Propylene Glycol Itching   Current Outpatient Medications on File Prior to Visit  Medication Sig Dispense Refill  fluocinolone  (DERMA-SMOOTHE) 0.01 % external oil APPLY TO THE AFFECTED AREAS OF THE SCALP THREE TIMES PER WEEK (DO NOT [facility] EACH TIME) AS NEEDED *DO NOT APPLY TO FACE*  fluocinolone-hydroq-tretinoin (TRI-LUMA) 0.01-4-0.05 % topical cream APPLY TOPICALLY ONCE A DAY AS DIRECTED  biotin 1 mg Cap Take by mouth  DOCOSAHEXAENOIC ACID ORAL Take by mouth once daily  finasteride (PROSCAR) 5 mg tablet Take 5 mg by mouth once daily  GINGER ROOT EXTRACT ORAL Take by mouth  glucosamine 500 mg Cap capsule Take by mouth  magnesium 250 mg Tab Take by mouth  methylsulfonylmethane 1,000 mg Cap Take by mouth  multivitamin capsule Take 1 capsule by mouth once daily  VITAMIN B COMPLEX ORAL Take 1 capsule by mouth once daily   No current facility-administered medications on file prior to visit.   Family History  Problem Relation Age of Onset  Obesity Mother  Diabetes Mother  High blood pressure (Hypertension) Mother  Diabetes Sister  Stroke Sister  Obesity Sister  Obesity Brother    Social History   Tobacco Use  Smoking Status Never  Smokeless Tobacco Never    Social History   Socioeconomic History  Marital status: Married  Tobacco Use  Smoking status: Never  Smokeless tobacco: Never  Vaping Use  Vaping Use: Never used  Substance and Sexual Activity  Alcohol use: Never  Drug use: Never   Objective:   Vitals:  BP: 124/78  Pulse: 75  Temp: 36.4 C (97.5 F)  SpO2: 99%  Weight: 51.9 kg (114 lb 6.4 oz)  Height: 154.9 cm ('5\' 1"' )   Body mass index is 21.62 kg/m.  Physical Exam Vitals reviewed.  Constitutional:  General: She is not in acute distress. Appearance: Normal appearance.  HENT:  Head: Normocephalic and atraumatic.  Right Ear: External ear normal.  Left Ear: External ear normal.  Nose: Nose normal.  Mouth/Throat:  Mouth: Mucous membranes are moist.  Pharynx: Oropharynx is clear.  Eyes:  General: No scleral icterus. Extraocular Movements: Extraocular movements intact.   Conjunctiva/sclera: Conjunctivae normal.  Pupils: Pupils are equal, round, and reactive to light.  Cardiovascular:  Rate and Rhythm: Normal rate and regular rhythm.  Pulses: Normal pulses.  Heart sounds: Normal heart sounds.  Pulmonary:  Effort: Pulmonary effort is normal. No respiratory distress.  Breath sounds: Normal breath sounds.  Abdominal:  General: Bowel sounds are normal.  Palpations: Abdomen is soft.  Tenderness: There is no abdominal tenderness.  Musculoskeletal:  General: No swelling, tenderness or deformity. Normal range of motion.  Cervical back: Normal range of motion and neck supple.  Skin: General: Skin is warm and dry.  Coloration: Skin is not jaundiced.  Neurological:  General: No focal deficit present.  Mental Status: She is alert and oriented to person, place, and time.  Psychiatric:  Mood and Affect: Mood normal.  Behavior: Behavior normal.    Breast: There is a 2 cm palpable mobile mass in the upper portion of the left breast. There is no palpable mass in the right breast. There is no palpable axillary, supraclavicular, or cervical lymphadenopathy.  Labs, Imaging and Diagnostic Testing:  Assessment and Plan:   Diagnoses and all orders for this visit:  Malignant neoplasm of upper-outer quadrant of left breast in female, estrogen receptor positive (CMS-HCC) - [facility] Case Posting Request; Future - Ambulatory Referral to Oncology-Medical - Ambulatory Referral to Radiation Oncology - Ambulatory Referral to Physical Therapy    The patient appears to have a new cancer in the upper portion of the left breast with clinically negative nodes. Given her previous history of lumpectomy and radiation the recommended answer would be for mastectomy on the left. Given her age and the favorable nature of her cancer she may be a candidate for lumpectomy followed by antiestrogens. I will contact her medical oncologist to see if they agree. I have discussed with her in  detail the risks and benefits of the different operations as well as some of the technical aspects and she understands and wants to proceed with lumpectomy if that is possible. She will be a good candidate for sentinel node biopsy again as well.

## 2022-07-12 NOTE — Transfer of Care (Signed)
Immediate Anesthesia Transfer of Care Note  Patient: Jeanne Mckinney  Procedure(s) Performed: LEFT BREAST LUMPECTOMY WITH RADIOACTIVE SEED AND SENTINEL NODE BIOPSY (Left: Breast)  Patient Location: PACU  Anesthesia Type:General  Level of Consciousness: drowsy and patient cooperative  Airway & Oxygen Therapy: Patient Spontanous Breathing and Patient connected to face mask oxygen  Post-op Assessment: Report given to RN and Post -op Vital signs reviewed and stable  Post vital signs: Reviewed and stable  Last Vitals:  Vitals Value Taken Time  BP 133/84 07/12/22 1122  Temp    Pulse 61 07/12/22 1123  Resp 10 07/12/22 1123  SpO2 100 % 07/12/22 1123  Vitals shown include unvalidated device data.  Last Pain:  Vitals:   07/12/22 0808  TempSrc: Oral  PainSc: 0-No pain      Patients Stated Pain Goal: 3 (35/24/81 8590)  Complications: No notable events documented.

## 2022-07-13 ENCOUNTER — Encounter (HOSPITAL_BASED_OUTPATIENT_CLINIC_OR_DEPARTMENT_OTHER): Payer: Self-pay | Admitting: General Surgery

## 2022-07-13 NOTE — Anesthesia Postprocedure Evaluation (Signed)
Anesthesia Post Note  Patient: Jeanne Mckinney  Procedure(s) Performed: LEFT BREAST LUMPECTOMY WITH RADIOACTIVE SEED AND SENTINEL NODE BIOPSY (Left: Breast)     Patient location during evaluation: PACU Anesthesia Type: General Level of consciousness: sedated and patient cooperative Pain management: pain level controlled Vital Signs Assessment: post-procedure vital signs reviewed and stable Respiratory status: spontaneous breathing Cardiovascular status: stable Anesthetic complications: no   No notable events documented.  Last Vitals:  Vitals:   07/12/22 1145 07/12/22 1223  BP: (!) 144/87 132/83  Pulse: (!) 58 (!) 59  Resp: 16 16  Temp:  36.4 C  SpO2: 98% 99%    Last Pain:  Vitals:   07/13/22 0958  TempSrc:   PainSc: 0-No pain                 Nolon Nations

## 2022-07-14 ENCOUNTER — Encounter (HOSPITAL_COMMUNITY): Payer: Self-pay

## 2022-07-15 LAB — SURGICAL PATHOLOGY

## 2022-07-17 IMAGING — CR DG SHOULDER 2+V*L*
3 series · 3 of 3 positions shown · non-contrast
Comparison: None.

CLINICAL DATA: Left shoulder pain, onset yesterday.

EXAM:
LEFT SHOULDER - 2+ VIEW

[w shoulder grashey left]
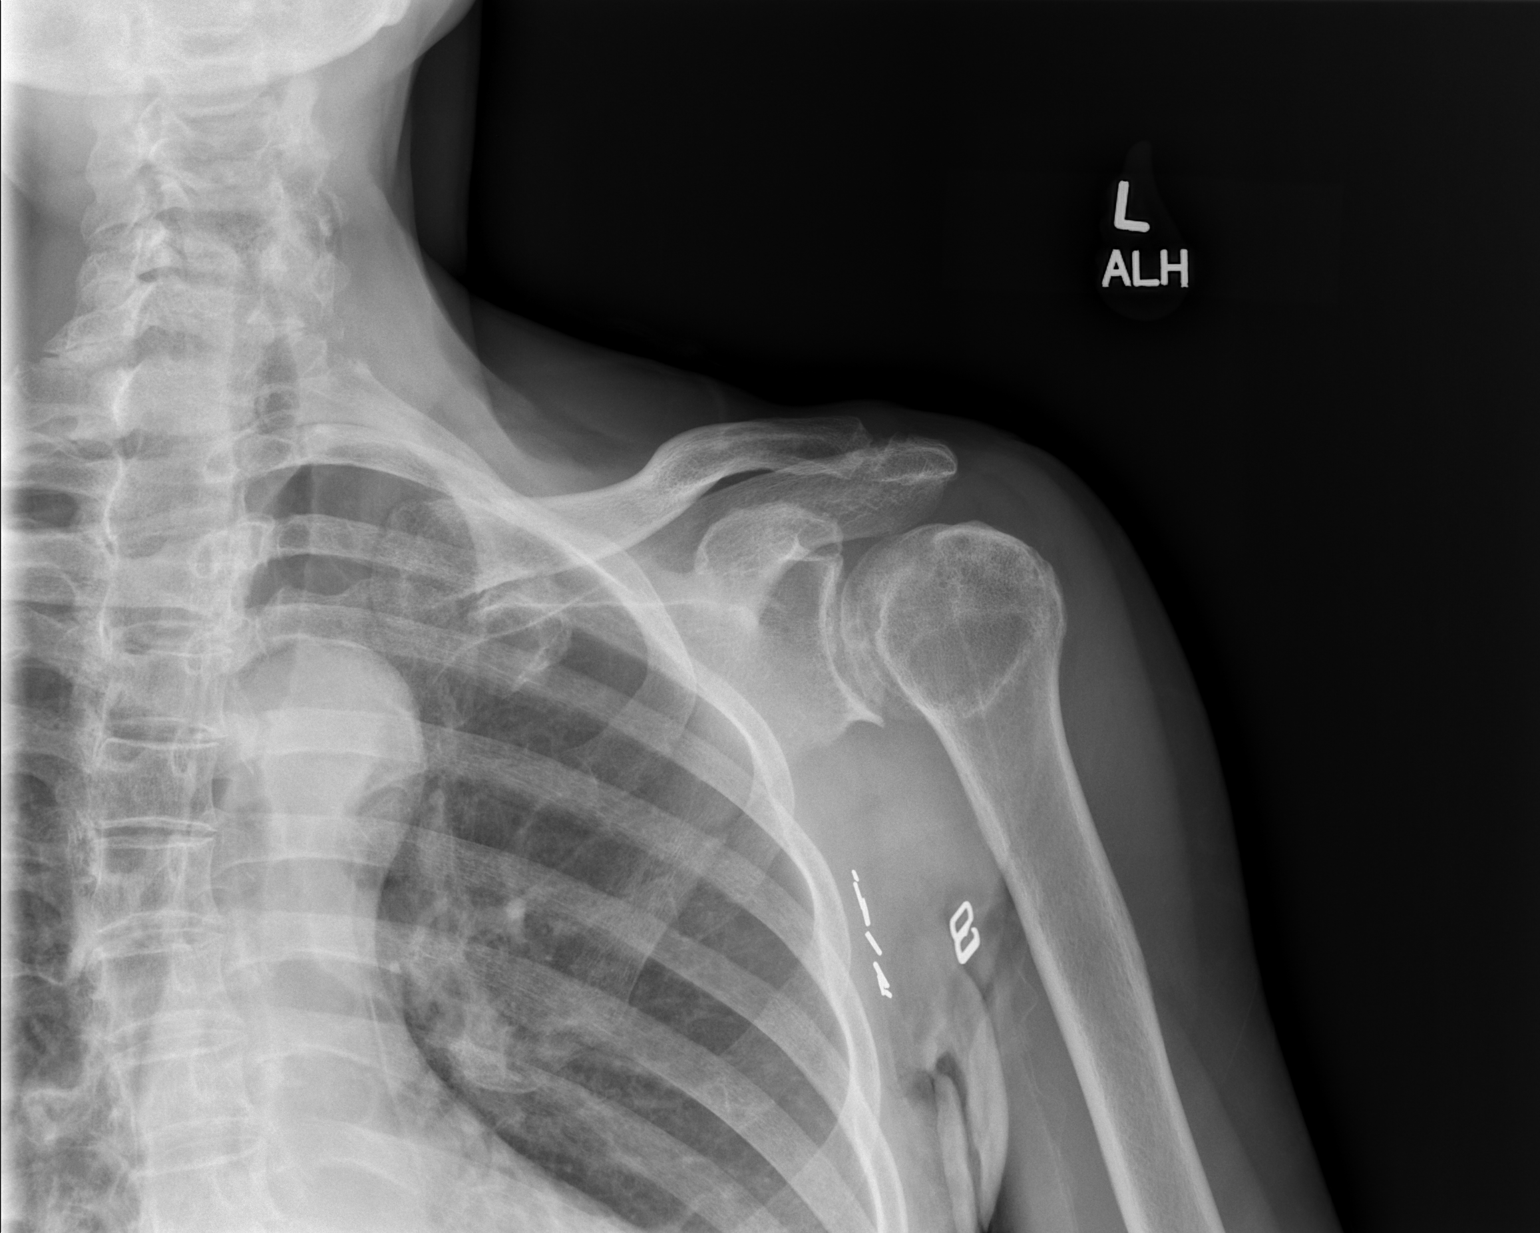

[w shoulder y-view left]
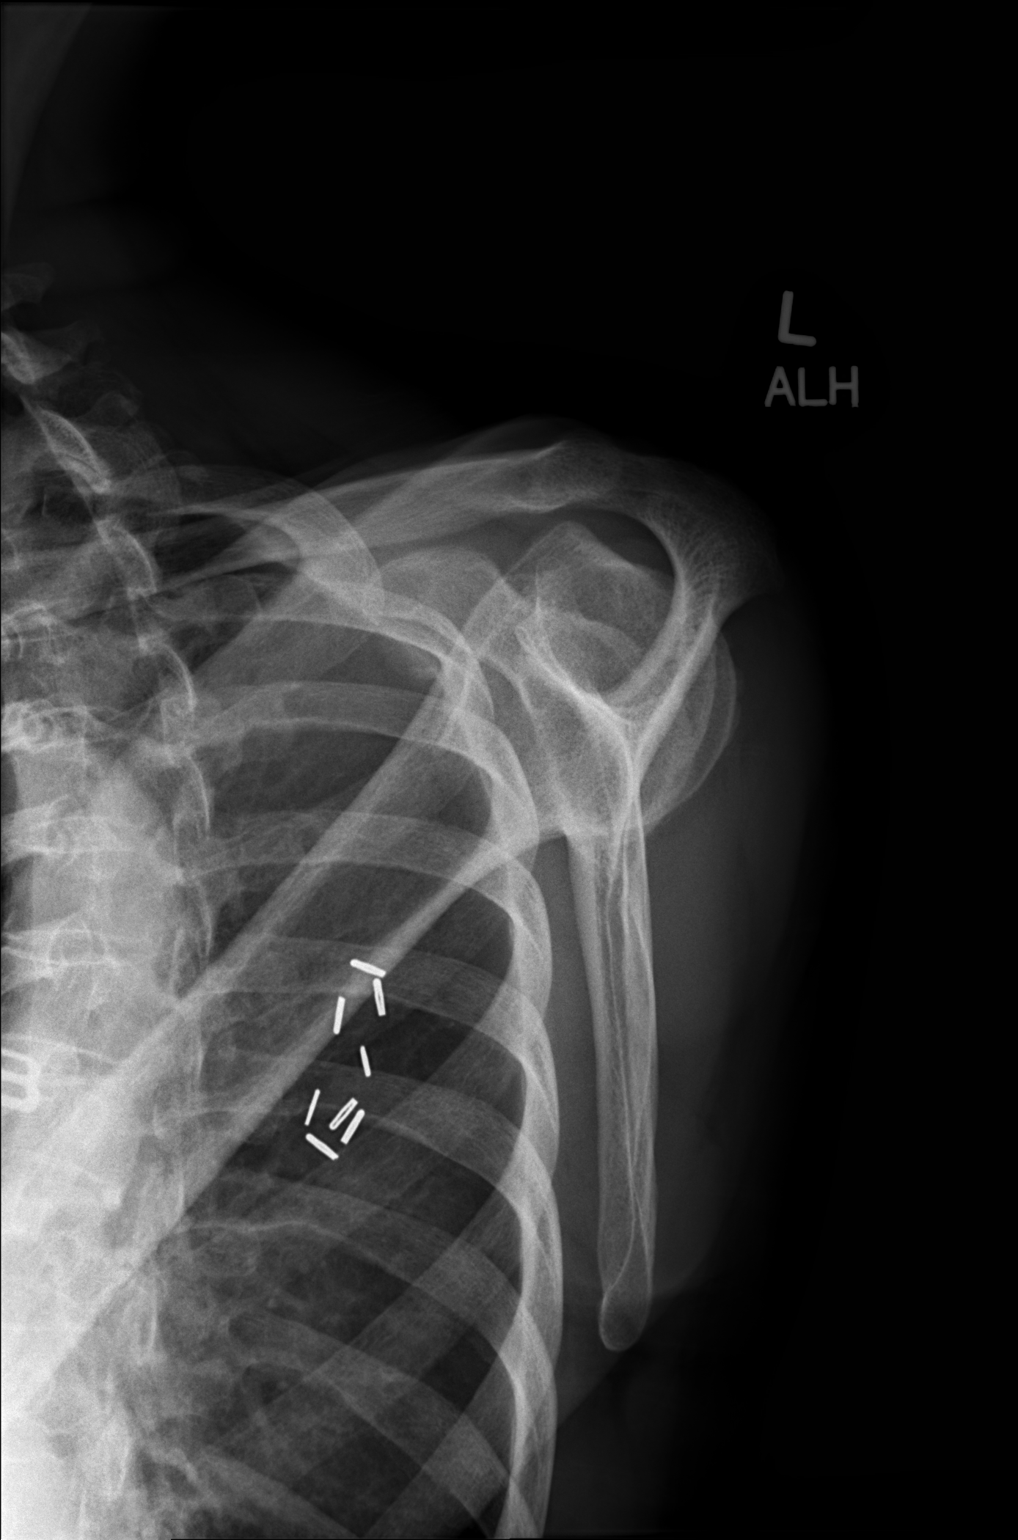

[x shoulder axillary left]
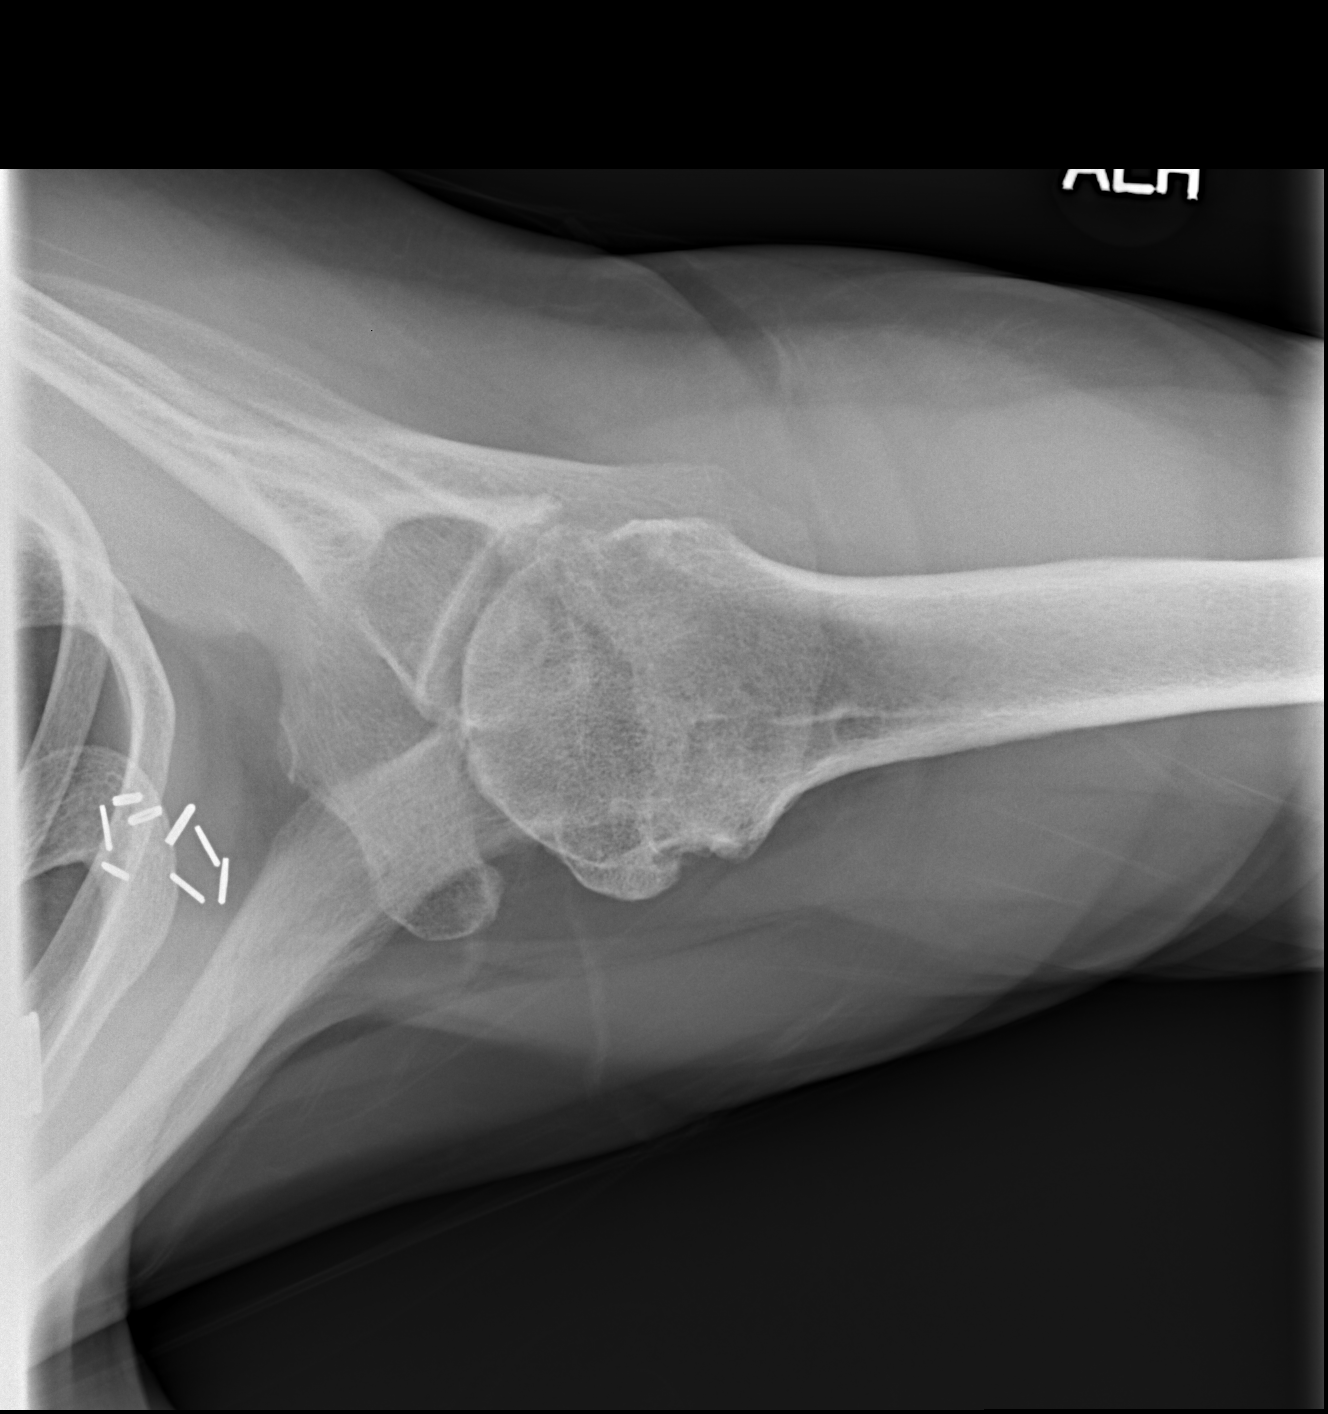

[3 of 3 positions shown; findings below may reference images not displayed]

FINDINGS: Moderate glenoid spurring with preservation of glenohumeral joint
space. Subchondral cystic changes in the humeral head with probable
osteophytes. Mild acromioclavicular degenerative change with small
inferiorly directed osteophytes. No fracture or evidence of focal
bone lesion. No bony destruction. No soft tissue calcifications.
There are surgical clips in the left axilla.
IMPRESSION: 1. Moderate glenohumeral osteoarthritis.
2. Mild acromioclavicular degenerative change.

## 2022-07-26 ENCOUNTER — Inpatient Hospital Stay: Payer: Medicare PPO | Attending: Genetic Counselor | Admitting: Oncology

## 2022-07-26 ENCOUNTER — Other Ambulatory Visit: Payer: Self-pay

## 2022-07-26 VITALS — BP 142/85 | HR 60 | Temp 98.1°F | Resp 20 | Ht 61.0 in | Wt 114.6 lb

## 2022-07-26 DIAGNOSIS — C50412 Malignant neoplasm of upper-outer quadrant of left female breast: Secondary | ICD-10-CM | POA: Diagnosis not present

## 2022-07-26 DIAGNOSIS — Z170 Estrogen receptor positive status [ER+]: Secondary | ICD-10-CM | POA: Diagnosis not present

## 2022-07-26 MED ORDER — ANASTROZOLE 1 MG PO TABS: 1.0000 mg | ORAL_TABLET | Freq: Every day | ORAL | 2 refills | Status: DC

## 2022-07-26 NOTE — Progress Notes (Addendum)
[facility] New Patient Consult   Requesting MD: Griselda Miner, Md [address] [place],  [place] [address]   Jeanne Mckinney 76 y.o.  1946/09/04    Reason for Consult: Breast cancer   HPI: Ms. Kitchin noted a "lump "at the upper left breast on Memorial Day of this year.  A mammogram 05/12/2022 revealed postoperative changes in the left breast.  No mass was seen.  A left breast ultrasound was performed.  There was a hard pea-sized lump at the 12 o'clock position of the left breast.  Ultrasound confirmed a 1.1 x 0.5 x 0.9 cm oval mass at the 12 o'clock position of the left breast.  No abnormality in the left axilla. She underwent a biopsy of the left breast mass 05/17/2022.  This confirmed an invasive ductal carcinoma, grade 2 measuring 0.5 mm.  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  The tumor estrogen receptor returned at 95%, progesterone receptor 5%, Ki67 10%, and HER2 negative (1+).  She was referred to Dr. Carolynne Edouard and was taken to a left lumpectomy with radioactive seed localization and left axillary sentinel lymph node biopsy on 07/12/2022.  The pathology confirmed a grade 2 invasive ductal carcinoma, 20 mm.  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  Resection margins are negative for invasive carcinoma and DCIS.  2 left axillary lymph nodes were negative.  She reports feeling well prior to the breast surgery.  She underwent left shoulder arthroplasty 01/27/2022.  She continues physical therapy. Past Medical History:  Diagnosis Date   Allergic rhinitis    Alopecia    mild   Arthralgia    Arthritis    Breast cancer (HCC) 1999   left breast cancer   Family history of breast cancer    Hemorrhoid    Hiatal hernia    History of hypertension    Hyperlipidemia    Iron deficiency anemia    Malignant neoplasm of upper-outer quadrant of left female breast (HCC)    Mitral valve prolapse    Personal history of  malignant neoplasm of breast    Seasonal allergies     .  G5, P5  Past Surgical History:  Procedure Laterality Date   BREAST LUMPECTOMY  1999   left breast   BREAST LUMPECTOMY WITH RADIOACTIVE SEED AND SENTINEL LYMPH NODE BIOPSY Left 07/12/2022   Procedure: LEFT BREAST LUMPECTOMY WITH RADIOACTIVE SEED AND SENTINEL NODE BIOPSY;  Surgeon: Chevis Pretty III, MD;  Location: [facility];  Service: General;  Laterality: Left;   left hip replacement  09-24-14   OTHER SURGICAL HISTORY     cyst removal buttock   right hip intraarticular injection  09-26-14   TOTAL SHOULDER ARTHROPLASTY Left 01/27/2022   Procedure: TOTAL SHOULDER ARTHROPLASTY;  Surgeon: Bjorn Pippin, MD;  Location: [facility];  Service: Orthopedics;  Laterality: Left;    Medications: Reviewed  Allergies:  Allergies  Allergen Reactions   Lactose Intolerance (Gi)    Propylene Glycol Itching    Family history: Her daughter had breast cancer  Social History:   She lives with her husband in [place].  She works as a Marketing executive.  She does not use cigarettes or alcohol.  No transfusion history.  No risk factor for HIV or hepatitis.  ROS:   Positives include: Occasional night sweats, constipation following breast surgery 07/12/2022  A complete ROS was otherwise negative.  Physical Exam:  Blood pressure Marland Kitchen)  142/85, pulse 60, temperature 98.1 F (36.7 C), temperature source Oral, resp. rate 20, height 5\' 1"  (1.549 m), weight 114 lb 9.6 oz (52 kg), SpO2 100 %.  HEENT: Oropharynx without visible mass, neck without mass Lungs: Clear bilaterally Cardiac: Regular rate and rhythm Abdomen: No hepatosplenomegaly  Vascular: No leg edema Lymph nodes: No cervical, supraclavicular, axillary, or inguinal nodes Neurologic: Alert and oriented, the motor exam appears intact in the upper and lower extremities bilaterally Skin: No rash Musculoskeletal: No spine tenderness Breast: Right breast  without mass.  Status post left lumpectomy.  Firmness/induration surrounding the lumpectomy and left axillary scars.  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, negative resection margins, 0/2 lymph nodes, associated DCIS, grade 2 with comedonecrosis and focal squamous differentiation Left breast needle core biopsy 05/17/2022-invasive ductal carcinoma, grade 2, 0.5 mm, DCIS, grade 2, solid pattern ER 95%, PR 5%, Ki67 10%, HER2 negative (1+) Diagnostic mammogram 05/12/2022-no mass seen, postoperative changes in the left breas Left breast ultrasound five 3123-1.1 x 0.5 x 0.9 cm oval mass with an indistinct margin at the 12 o'clock position of the left breast, no abnormality in the left axilla Left breast cancer-stage I, December 1999, lipectomy and adjuvant radiation, adjuvant tamoxifen for 5 years, adjuvant letrozole February 2005-January 2010 Osteoarthritis G5 P5 Daughter with history of breast cancer   Disposition:   Ms. Gillyard has been diagnosed with stage I left-sided breast cancer.  The tumor is hormone receptor positive.  The surgical margins are negative.  Ms. Hearne was treated with adjuvant left breast radiation and hormonal therapy after being diagnosed with stage I left-sided breast cancer in 1999.  She saw Dr. Mitzi Hansen prior to surgery.  I believe she is a candidate to forego additional radiation based on her age, the tumor stage, ER positivity, and history of previous left  breast radiation.  I will contact Dr. Mitzi Hansen to get his opinion.   Ms. Godard has a good prognosis for a long-term disease-free survival with regard to the current breast cancer.  I recommend adjuvant aromatase inhibitor therapy.  She agrees.  She will begin anastrozole.  We reviewed potential toxicities associated with anastrozole including chance of hot flashes, arthralgias, alopecia, and decreased bone density.  She is taking vitamin D and exercises.  She reports her bone density is followed by Dr. August Saucer. Ms. Steere will follow-up with Dr. Carolynne Edouard.  She will return for an office visit in 4 months.  Thornton Papas, MD  07/26/2022, 1:30 PM Addendum: Dr. Mitzi Hansen reviewed the case.  He does not recommend adjuvant radiation.

## 2022-09-16 ENCOUNTER — Telehealth: Payer: Self-pay

## 2022-09-16 NOTE — Telephone Encounter (Signed)
Patient called in and had a question. I called the patient no answer, left a message to called back.

## 2022-11-25 ENCOUNTER — Inpatient Hospital Stay: Payer: Medicare PPO | Attending: Oncology | Admitting: Oncology

## 2022-11-25 VITALS — BP 136/88 | HR 60 | Temp 98.1°F | Resp 18 | Ht 61.0 in | Wt 111.8 lb

## 2022-11-25 DIAGNOSIS — Z170 Estrogen receptor positive status [ER+]: Secondary | ICD-10-CM | POA: Diagnosis not present

## 2022-11-25 DIAGNOSIS — Z803 Family history of malignant neoplasm of breast: Secondary | ICD-10-CM | POA: Insufficient documentation

## 2022-11-25 DIAGNOSIS — Z79811 Long term (current) use of aromatase inhibitors: Secondary | ICD-10-CM | POA: Diagnosis not present

## 2022-11-25 DIAGNOSIS — C50912 Malignant neoplasm of unspecified site of left female breast: Secondary | ICD-10-CM | POA: Insufficient documentation

## 2022-11-25 DIAGNOSIS — M1990 Unspecified osteoarthritis, unspecified site: Secondary | ICD-10-CM | POA: Diagnosis not present

## 2022-11-25 DIAGNOSIS — C50412 Malignant neoplasm of upper-outer quadrant of left female breast: Secondary | ICD-10-CM | POA: Diagnosis not present

## 2022-11-25 NOTE — Progress Notes (Signed)
  Jeanne Mckinney   Diagnosis: Breast cancer  INTERVAL HISTORY:   Jeanne Mckinney returns as scheduled.  She is taking anastrozole.  She reports mild hot flashes.  She has arthralgias predating anastrozole.  She has noted increased discomfort in the bilateral forearms.  She has "swelling "when she eats carbohydrates. There has been firmness at the left lumpectomy site since 1 week following surgery.  She saw Dr. Marlou Starks on 11/23/2022.  He feels the area of firmness is due to fat necrosis.  Objective:  Vital signs in last 24 hours:  Blood pressure 136/88, pulse 60, temperature 98.1 F (36.7 C), temperature source Oral, resp. rate 18, height _0  (1.549 m), weight 111 lb 12.8 oz (50.7 kg), SpO2 100 %.    HEENT: Neck without mass, slight fullness in the lateral right lower neck scalene area Lymphatics: No cervical or supraclavicular nodes, "shotty "axillary nodes Resp: Lungs clear bilaterally Cardio: Rate and rhythm GI: No hepatosplenomegaly Vascular: No leg edema  Breast: Right breast without mass.  Firmness at the upper left breast superior to the lumpectomy incision.  No discrete mass.  No evidence of recurrent tumor at the left lower breast lumpectomy scar.  Both axillae appear benign.  Portacath/PICC-without erythema  Lab Results:  Lab Results  Component Value Date   WBC 5.6 12/23/2014   HGB 8.6 (A) 12/23/2014   HCT 27 (A) 12/23/2014   PLT 255 12/23/2014    CMP  Lab Results  Component Value Date   NA 141 11/06/2010   K 3.6 11/06/2010   CL 106 11/06/2010   GLUCOSE 78 11/06/2010   BUN 12 11/06/2010   CREATININE 0.9 11/06/2010    Medications: I have reviewed the patient's current medications.   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 Left breast needle core biopsy 05/17/2022-invasive ductal carcinoma, grade 2, 0.5 mm, DCIS, grade 2, solid pattern ER 95%, PR 5%, Ki67 10%, HER2 negative (1+) Diagnostic mammogram 05/12/2022-no mass seen, postoperative changes in the left breas Left breast ultrasound five 3123-1.1 x 0.5 x 0.9 cm oval mass with an indistinct margin at the 12 o'clock position of the left breast, no abnormality in the left axilla Left breast cancer-stage I, December 1999, lipectomy and adjuvant radiation, adjuvant tamoxifen for 5 years, adjuvant letrozole February 2005-January 2010 Osteoarthritis G5 P5 Daughter with history of breast cancer    Disposition: Jeanne Mckinney is in clinical remission from breast cancer.  She appears to be tolerating the anastrozole well.  She will call for increased hot flashes or arthralgias.  She will return for an office visit in 8 months.  She will be due for a mammogram in approximately 6 months.  The firm area at the upper left breast is likely related to fat necrosis.  She will call if the area enlarges.  Betsy Coder, MD  11/25/2022  10:26 AM

## 2022-12-21 ENCOUNTER — Other Ambulatory Visit: Payer: Self-pay | Admitting: Internal Medicine

## 2022-12-21 ENCOUNTER — Ambulatory Visit
Admission: RE | Admit: 2022-12-21 | Discharge: 2022-12-21 | Disposition: A | Discharge status: Home / Self Care | Payer: Medicare PPO | Source: Ambulatory Visit | Attending: Internal Medicine | Admitting: Internal Medicine

## 2022-12-21 DIAGNOSIS — R109 Unspecified abdominal pain: Secondary | ICD-10-CM

## 2023-04-27 ENCOUNTER — Other Ambulatory Visit: Payer: Self-pay | Admitting: Oncology

## 2023-06-23 ENCOUNTER — Encounter: Payer: Self-pay | Admitting: Oncology

## 2023-07-28 ENCOUNTER — Inpatient Hospital Stay: Payer: Medicare PPO | Attending: Oncology | Admitting: Oncology

## 2023-07-28 VITALS — BP 138/82 | HR 60 | Temp 98.1°F | Resp 18 | Ht 61.0 in | Wt 113.8 lb

## 2023-07-28 DIAGNOSIS — Z79811 Long term (current) use of aromatase inhibitors: Secondary | ICD-10-CM | POA: Diagnosis not present

## 2023-07-28 DIAGNOSIS — C50919 Malignant neoplasm of unspecified site of unspecified female breast: Secondary | ICD-10-CM | POA: Insufficient documentation

## 2023-07-28 DIAGNOSIS — C50412 Malignant neoplasm of upper-outer quadrant of left female breast: Secondary | ICD-10-CM

## 2023-07-28 DIAGNOSIS — Z170 Estrogen receptor positive status [ER+]: Secondary | ICD-10-CM | POA: Diagnosis not present

## 2023-07-28 NOTE — Progress Notes (Signed)
  [facility] OFFICE PROGRESS NOTE   Diagnosis: Breast cancer  INTERVAL HISTORY:   Jeanne Mckinney returns as scheduled.  She continues anastrozole.  She reports hot flashes and sweats when she began anastrozole last summer, this has improved.  No arthralgias.  She generally feels well.  The area of firmness at the mid upper left breast has improved. A bilateral mammogram on 05/17/2023 revealed postoperative changes in the left breast.  No evidence of malignancy.  Objective:  Vital signs in last 24 hours:  Blood pressure 138/82, pulse 60, temperature 98.1 F (36.7 C), temperature source Oral, resp. rate 18, height 5\' 1"  (1.549 m), weight 113 lb 12.8 oz (51.6 kg), SpO2 100%.   Lymphatics: No cervical, supraclavicular, or axillary nodes Resp: Clear bilaterally Cardio: Regular rate and rhythm GI: No hepatosplenomegaly Vascular: No leg edema Breast: No mass in either breast.  Mild firmness at the upper left breast superior to the lumpectomy incision.  No mass.  No evidence of recurrent tumor the left lower lumpectomy scar.   Lab Results:  Lab Results  Component Value Date   WBC 5.6 12/23/2014   HGB 8.6 (A) 12/23/2014   HCT 27 (A) 12/23/2014   PLT 255 12/23/2014    CMP  Lab Results  Component Value Date   NA 141 11/06/2010   K 3.6 11/06/2010   CL 106 11/06/2010   GLUCOSE 78 11/06/2010   BUN 12 11/06/2010   CREATININE 0.9 11/06/2010   Medications: I have reviewed the patient's current medications.   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, 0/2 lymph nodes, associated DCIS, grade 2 with comedonecrosis and focal squamous differentiation Left breast needle core biopsy 05/17/2022-invasive ductal carcinoma, grade 2, 0.5 mm, DCIS, grade 2, solid pattern ER 95%, PR 5%, Ki67 10%, HER2 negative (1+) Diagnostic mammogram  05/12/2022-no mass seen, postoperative changes in the left breas Left breast ultrasound 05/12/2022-1.1 x 0.5 x 0.9 cm oval mass with an indistinct margin at the 12 o'clock position of the left breast, no abnormality in the left axilla Anastrozole 07/26/2022 Left breast cancer-stage I, December 1999, lipectomy and adjuvant radiation, adjuvant tamoxifen for 5 years, adjuvant letrozole February 2005-January 2010 Osteoarthritis G5 P5 Daughter with history of breast cancer     Disposition: Jeanne Mckinney is in clinical remission from breast cancer.  She is tolerating the anastrozole well.  She will continue anastrozole.  She will continue yearly mammography.  Jeanne Mckinney will return for an office visit in 9 months.  Thornton Papas, MD  07/28/2023  10:14 AM

## 2023-09-19 IMAGING — CR DG SHOULDER 1V*L*
1 series · 1 of 1 positions shown · non-contrast
Comparison: 11/24/2020

CLINICAL DATA: Left shoulder arthroplasty

EXAM:
LEFT SHOULDER

[shoulder ap]
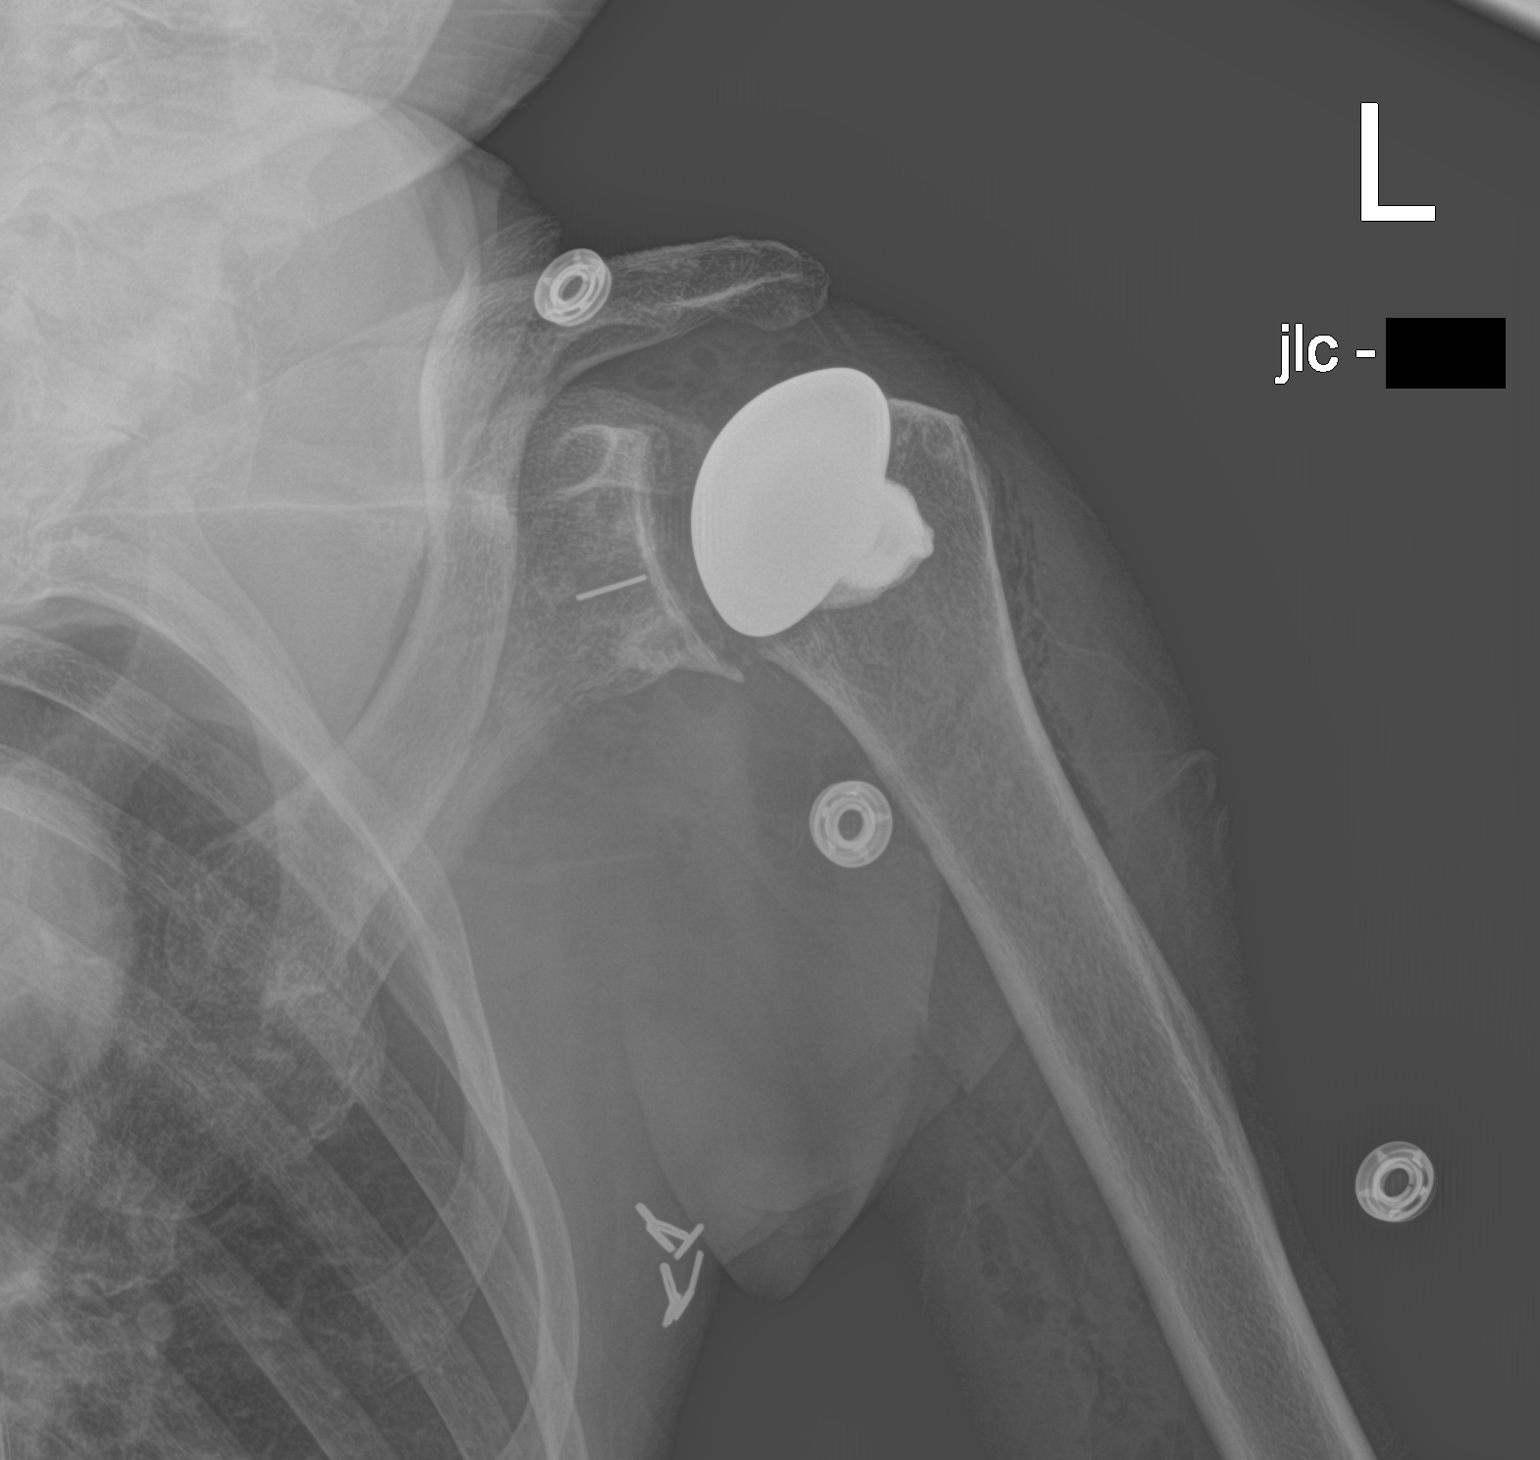

[1 of 1 positions shown; findings below may reference images not displayed]

FINDINGS: Single frontal view of the left shoulder demonstrates interval
placement of a left shoulder arthroplasty in the expected position
without signs of acute complication. Postsurgical changes in the
overlying soft tissues.
IMPRESSION: 1. Left shoulder arthroplasty as above.

## 2023-12-16 DIAGNOSIS — J300 Vasomotor rhinitis: Secondary | ICD-10-CM | POA: Diagnosis not present

## 2023-12-16 DIAGNOSIS — R7303 Prediabetes: Secondary | ICD-10-CM | POA: Diagnosis not present

## 2023-12-16 DIAGNOSIS — Z853 Personal history of malignant neoplasm of breast: Secondary | ICD-10-CM | POA: Diagnosis not present

## 2023-12-16 DIAGNOSIS — Z630 Problems in relationship with spouse or partner: Secondary | ICD-10-CM | POA: Diagnosis not present

## 2023-12-16 DIAGNOSIS — I119 Hypertensive heart disease without heart failure: Secondary | ICD-10-CM | POA: Diagnosis not present

## 2023-12-16 DIAGNOSIS — L659 Nonscarring hair loss, unspecified: Secondary | ICD-10-CM | POA: Diagnosis not present

## 2023-12-16 DIAGNOSIS — E782 Mixed hyperlipidemia: Secondary | ICD-10-CM | POA: Diagnosis not present

## 2023-12-16 DIAGNOSIS — Z170 Estrogen receptor positive status [ER+]: Secondary | ICD-10-CM | POA: Diagnosis not present

## 2023-12-16 DIAGNOSIS — M545 Low back pain, unspecified: Secondary | ICD-10-CM | POA: Diagnosis not present

## 2023-12-28 DIAGNOSIS — F4323 Adjustment disorder with mixed anxiety and depressed mood: Secondary | ICD-10-CM | POA: Diagnosis not present

## 2024-01-18 DIAGNOSIS — R7303 Prediabetes: Secondary | ICD-10-CM | POA: Diagnosis not present

## 2024-01-18 DIAGNOSIS — E782 Mixed hyperlipidemia: Secondary | ICD-10-CM | POA: Diagnosis not present

## 2024-01-18 DIAGNOSIS — I119 Hypertensive heart disease without heart failure: Secondary | ICD-10-CM | POA: Diagnosis not present

## 2024-01-18 DIAGNOSIS — E559 Vitamin D deficiency, unspecified: Secondary | ICD-10-CM | POA: Diagnosis not present

## 2024-01-19 DIAGNOSIS — L658 Other specified nonscarring hair loss: Secondary | ICD-10-CM | POA: Diagnosis not present

## 2024-01-19 DIAGNOSIS — L6681 Central centrifugal cicatricial alopecia: Secondary | ICD-10-CM | POA: Diagnosis not present

## 2024-01-30 ENCOUNTER — Other Ambulatory Visit: Payer: Self-pay | Admitting: Oncology

## 2024-02-08 DIAGNOSIS — F4323 Adjustment disorder with mixed anxiety and depressed mood: Secondary | ICD-10-CM | POA: Diagnosis not present

## 2024-02-13 DIAGNOSIS — M19011 Primary osteoarthritis, right shoulder: Secondary | ICD-10-CM | POA: Diagnosis not present

## 2024-02-13 DIAGNOSIS — M542 Cervicalgia: Secondary | ICD-10-CM | POA: Diagnosis not present

## 2024-02-14 DIAGNOSIS — Z170 Estrogen receptor positive status [ER+]: Secondary | ICD-10-CM | POA: Diagnosis not present

## 2024-02-14 DIAGNOSIS — C50412 Malignant neoplasm of upper-outer quadrant of left female breast: Secondary | ICD-10-CM | POA: Diagnosis not present

## 2024-02-28 ENCOUNTER — Ambulatory Visit
Admission: RE | Admit: 2024-02-28 | Discharge: 2024-02-28 | Disposition: A | Discharge status: Home / Self Care | Source: Ambulatory Visit | Attending: Orthopaedic Surgery | Admitting: Orthopaedic Surgery

## 2024-02-28 ENCOUNTER — Other Ambulatory Visit: Payer: Self-pay | Admitting: Orthopaedic Surgery

## 2024-02-28 DIAGNOSIS — G8929 Other chronic pain: Secondary | ICD-10-CM | POA: Diagnosis not present

## 2024-02-28 DIAGNOSIS — M19011 Primary osteoarthritis, right shoulder: Secondary | ICD-10-CM | POA: Diagnosis not present

## 2024-02-28 DIAGNOSIS — M25511 Pain in right shoulder: Secondary | ICD-10-CM | POA: Diagnosis not present

## 2024-03-16 DIAGNOSIS — M25511 Pain in right shoulder: Secondary | ICD-10-CM | POA: Diagnosis not present

## 2024-03-16 DIAGNOSIS — E782 Mixed hyperlipidemia: Secondary | ICD-10-CM | POA: Diagnosis not present

## 2024-03-16 DIAGNOSIS — J300 Vasomotor rhinitis: Secondary | ICD-10-CM | POA: Diagnosis not present

## 2024-03-16 DIAGNOSIS — R918 Other nonspecific abnormal finding of lung field: Secondary | ICD-10-CM | POA: Diagnosis not present

## 2024-03-16 DIAGNOSIS — I119 Hypertensive heart disease without heart failure: Secondary | ICD-10-CM | POA: Diagnosis not present

## 2024-03-16 DIAGNOSIS — Z170 Estrogen receptor positive status [ER+]: Secondary | ICD-10-CM | POA: Diagnosis not present

## 2024-03-16 DIAGNOSIS — M545 Low back pain, unspecified: Secondary | ICD-10-CM | POA: Diagnosis not present

## 2024-03-16 DIAGNOSIS — Z853 Personal history of malignant neoplasm of breast: Secondary | ICD-10-CM | POA: Diagnosis not present

## 2024-03-20 DIAGNOSIS — J301 Allergic rhinitis due to pollen: Secondary | ICD-10-CM | POA: Diagnosis not present

## 2024-03-20 DIAGNOSIS — Z833 Family history of diabetes mellitus: Secondary | ICD-10-CM | POA: Diagnosis not present

## 2024-03-20 DIAGNOSIS — E785 Hyperlipidemia, unspecified: Secondary | ICD-10-CM | POA: Diagnosis not present

## 2024-03-20 DIAGNOSIS — C50919 Malignant neoplasm of unspecified site of unspecified female breast: Secondary | ICD-10-CM | POA: Diagnosis not present

## 2024-03-20 DIAGNOSIS — Z8249 Family history of ischemic heart disease and other diseases of the circulatory system: Secondary | ICD-10-CM | POA: Diagnosis not present

## 2024-03-20 DIAGNOSIS — R030 Elevated blood-pressure reading, without diagnosis of hypertension: Secondary | ICD-10-CM | POA: Diagnosis not present

## 2024-03-20 DIAGNOSIS — M1990 Unspecified osteoarthritis, unspecified site: Secondary | ICD-10-CM | POA: Diagnosis not present

## 2024-03-20 DIAGNOSIS — Z809 Family history of malignant neoplasm, unspecified: Secondary | ICD-10-CM | POA: Diagnosis not present

## 2024-03-20 DIAGNOSIS — M545 Low back pain, unspecified: Secondary | ICD-10-CM | POA: Diagnosis not present

## 2024-03-22 DIAGNOSIS — F4323 Adjustment disorder with mixed anxiety and depressed mood: Secondary | ICD-10-CM | POA: Diagnosis not present

## 2024-03-22 DIAGNOSIS — M6281 Muscle weakness (generalized): Secondary | ICD-10-CM | POA: Diagnosis not present

## 2024-03-22 DIAGNOSIS — M25611 Stiffness of right shoulder, not elsewhere classified: Secondary | ICD-10-CM | POA: Diagnosis not present

## 2024-03-22 DIAGNOSIS — M19011 Primary osteoarthritis, right shoulder: Secondary | ICD-10-CM | POA: Diagnosis not present

## 2024-03-28 DIAGNOSIS — M75101 Unspecified rotator cuff tear or rupture of right shoulder, not specified as traumatic: Secondary | ICD-10-CM | POA: Diagnosis not present

## 2024-03-28 DIAGNOSIS — G8918 Other acute postprocedural pain: Secondary | ICD-10-CM | POA: Diagnosis not present

## 2024-03-28 DIAGNOSIS — M19011 Primary osteoarthritis, right shoulder: Secondary | ICD-10-CM | POA: Diagnosis not present

## 2024-04-02 DIAGNOSIS — M6281 Muscle weakness (generalized): Secondary | ICD-10-CM | POA: Diagnosis not present

## 2024-04-02 DIAGNOSIS — M19011 Primary osteoarthritis, right shoulder: Secondary | ICD-10-CM | POA: Diagnosis not present

## 2024-04-02 DIAGNOSIS — S46011D Strain of muscle(s) and tendon(s) of the rotator cuff of right shoulder, subsequent encounter: Secondary | ICD-10-CM | POA: Diagnosis not present

## 2024-04-02 DIAGNOSIS — M25611 Stiffness of right shoulder, not elsewhere classified: Secondary | ICD-10-CM | POA: Diagnosis not present

## 2024-04-05 DIAGNOSIS — M19011 Primary osteoarthritis, right shoulder: Secondary | ICD-10-CM | POA: Diagnosis not present

## 2024-04-05 DIAGNOSIS — M6281 Muscle weakness (generalized): Secondary | ICD-10-CM | POA: Diagnosis not present

## 2024-04-05 DIAGNOSIS — M25611 Stiffness of right shoulder, not elsewhere classified: Secondary | ICD-10-CM | POA: Diagnosis not present

## 2024-04-05 DIAGNOSIS — S46011D Strain of muscle(s) and tendon(s) of the rotator cuff of right shoulder, subsequent encounter: Secondary | ICD-10-CM | POA: Diagnosis not present

## 2024-04-06 DIAGNOSIS — M19012 Primary osteoarthritis, left shoulder: Secondary | ICD-10-CM | POA: Diagnosis not present

## 2024-04-11 DIAGNOSIS — M25611 Stiffness of right shoulder, not elsewhere classified: Secondary | ICD-10-CM | POA: Diagnosis not present

## 2024-04-11 DIAGNOSIS — M19011 Primary osteoarthritis, right shoulder: Secondary | ICD-10-CM | POA: Diagnosis not present

## 2024-04-11 DIAGNOSIS — M6281 Muscle weakness (generalized): Secondary | ICD-10-CM | POA: Diagnosis not present

## 2024-04-11 DIAGNOSIS — S46011D Strain of muscle(s) and tendon(s) of the rotator cuff of right shoulder, subsequent encounter: Secondary | ICD-10-CM | POA: Diagnosis not present

## 2024-04-19 DIAGNOSIS — M6281 Muscle weakness (generalized): Secondary | ICD-10-CM | POA: Diagnosis not present

## 2024-04-19 DIAGNOSIS — M25611 Stiffness of right shoulder, not elsewhere classified: Secondary | ICD-10-CM | POA: Diagnosis not present

## 2024-04-19 DIAGNOSIS — M19011 Primary osteoarthritis, right shoulder: Secondary | ICD-10-CM | POA: Diagnosis not present

## 2024-04-19 DIAGNOSIS — S46011D Strain of muscle(s) and tendon(s) of the rotator cuff of right shoulder, subsequent encounter: Secondary | ICD-10-CM | POA: Diagnosis not present

## 2024-04-26 DIAGNOSIS — M19011 Primary osteoarthritis, right shoulder: Secondary | ICD-10-CM | POA: Diagnosis not present

## 2024-04-26 DIAGNOSIS — M25611 Stiffness of right shoulder, not elsewhere classified: Secondary | ICD-10-CM | POA: Diagnosis not present

## 2024-04-26 DIAGNOSIS — M6281 Muscle weakness (generalized): Secondary | ICD-10-CM | POA: Diagnosis not present

## 2024-04-26 DIAGNOSIS — S46011D Strain of muscle(s) and tendon(s) of the rotator cuff of right shoulder, subsequent encounter: Secondary | ICD-10-CM | POA: Diagnosis not present

## 2024-04-27 ENCOUNTER — Inpatient Hospital Stay: Payer: Medicare PPO | Attending: Oncology | Admitting: Oncology

## 2024-04-27 ENCOUNTER — Telehealth: Payer: Self-pay | Admitting: Oncology

## 2024-04-27 VITALS — BP 150/91 | HR 63 | Temp 98.1°F | Resp 18 | Ht 61.0 in | Wt 116.4 lb

## 2024-04-27 DIAGNOSIS — Z853 Personal history of malignant neoplasm of breast: Secondary | ICD-10-CM | POA: Insufficient documentation

## 2024-04-27 DIAGNOSIS — C50412 Malignant neoplasm of upper-outer quadrant of left female breast: Secondary | ICD-10-CM | POA: Diagnosis not present

## 2024-04-27 DIAGNOSIS — Z170 Estrogen receptor positive status [ER+]: Secondary | ICD-10-CM

## 2024-04-27 NOTE — Progress Notes (Signed)
  [facility] OFFICE PROGRESS NOTE   Diagnosis: Breast cancer  INTERVAL HISTORY:   [name] returns as scheduled.  She continues anastrozole .  She has hot flashes.  No arthralgias.  She underwent right shoulder replacement approximately 1 month ago.  She is undergoing physical therapy.  No palpable change over either breast.  She is scheduled for a mammogram next month. She developed alopecia after being diagnosed with breast cancer 1999.  The alopecia has improved with a minoxidil solution. Objective:  Vital signs in last 24 hours:  Blood pressure (!) 150/91, pulse 63, temperature 98.1 F (36.7 C), temperature source Temporal, resp. rate 18, height 5\' 1"  (1.549 m), weight 116 lb 6.4 oz (52.8 kg), SpO2 98%.    Lymphatics: No cervical, supraclavicular, or axillary nodes Resp: Lungs clear bilaterally Cardio: Regular rate and rhythm GI: No hepatomegaly Vascular: No leg edema Breast: Bilateral breast without mass.  No evidence for recurrent tumor in the left breast.  Lab Results:  Lab Results  Component Value Date   WBC 5.6 12/23/2014   HGB 8.6 (A) 12/23/2014   HCT 27 (A) 12/23/2014   PLT 255 12/23/2014    CMP  Lab Results  Component Value Date   NA 141 11/06/2010   K 3.6 11/06/2010   CL 106 11/06/2010   GLUCOSE 78 11/06/2010   BUN 12 11/06/2010   CREATININE 0.9 11/06/2010    Medications: I have reviewed the patient's current medications.   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, 0/2 lymph nodes, associated DCIS, grade 2 with comedonecrosis and focal squamous differentiation Left breast needle core biopsy 05/17/2022-invasive ductal carcinoma, grade 2, 0.5 mm, DCIS, grade 2, solid pattern ER 95%, PR 5%, Ki67 10%, HER2 negative (1+) Diagnostic mammogram 05/12/2022-no mass seen, postoperative changes in  the left breas Left breast ultrasound 05/12/2022-1.1 x 0.5 x 0.9 cm oval mass with an indistinct margin at the 12 o'clock position of the left breast, no abnormality in the left axilla Anastrozole  07/26/2022 Left breast cancer-stage I, December 1999, lipectomy and adjuvant radiation, adjuvant tamoxifen for 5 years, adjuvant letrozole February 2005-January 2010 Osteoarthritis G5 P5 Daughter with history of breast cancer       Disposition: Jeanne Mckinney is in clinical remission from breast cancer.  She is scheduled for mammogram next month.  She will schedule a bone density scan.  She will continue anastrozole  and return for an office visit in 9 months.  Coni Deep, MD  04/27/2024  10:38 AM

## 2024-04-27 NOTE — Telephone Encounter (Signed)
 Patient has been scheduled for follow-up visit per 04/26/24 LOS.  Pt noted appt details on personal planner/calendar.

## 2024-05-03 DIAGNOSIS — M25611 Stiffness of right shoulder, not elsewhere classified: Secondary | ICD-10-CM | POA: Diagnosis not present

## 2024-05-03 DIAGNOSIS — M19011 Primary osteoarthritis, right shoulder: Secondary | ICD-10-CM | POA: Diagnosis not present

## 2024-05-03 DIAGNOSIS — M6281 Muscle weakness (generalized): Secondary | ICD-10-CM | POA: Diagnosis not present

## 2024-05-03 DIAGNOSIS — S46011D Strain of muscle(s) and tendon(s) of the rotator cuff of right shoulder, subsequent encounter: Secondary | ICD-10-CM | POA: Diagnosis not present

## 2024-05-03 DIAGNOSIS — F4323 Adjustment disorder with mixed anxiety and depressed mood: Secondary | ICD-10-CM | POA: Diagnosis not present

## 2024-05-08 DIAGNOSIS — M19011 Primary osteoarthritis, right shoulder: Secondary | ICD-10-CM | POA: Diagnosis not present

## 2024-05-08 DIAGNOSIS — M25611 Stiffness of right shoulder, not elsewhere classified: Secondary | ICD-10-CM | POA: Diagnosis not present

## 2024-05-08 DIAGNOSIS — M6281 Muscle weakness (generalized): Secondary | ICD-10-CM | POA: Diagnosis not present

## 2024-05-08 DIAGNOSIS — S46011D Strain of muscle(s) and tendon(s) of the rotator cuff of right shoulder, subsequent encounter: Secondary | ICD-10-CM | POA: Diagnosis not present

## 2024-05-15 DIAGNOSIS — J300 Vasomotor rhinitis: Secondary | ICD-10-CM | POA: Diagnosis not present

## 2024-05-15 DIAGNOSIS — Z170 Estrogen receptor positive status [ER+]: Secondary | ICD-10-CM | POA: Diagnosis not present

## 2024-05-15 DIAGNOSIS — R918 Other nonspecific abnormal finding of lung field: Secondary | ICD-10-CM | POA: Diagnosis not present

## 2024-05-15 DIAGNOSIS — M545 Low back pain, unspecified: Secondary | ICD-10-CM | POA: Diagnosis not present

## 2024-05-15 DIAGNOSIS — E782 Mixed hyperlipidemia: Secondary | ICD-10-CM | POA: Diagnosis not present

## 2024-05-15 DIAGNOSIS — Z96611 Presence of right artificial shoulder joint: Secondary | ICD-10-CM | POA: Diagnosis not present

## 2024-05-15 DIAGNOSIS — I119 Hypertensive heart disease without heart failure: Secondary | ICD-10-CM | POA: Diagnosis not present

## 2024-05-15 DIAGNOSIS — Z853 Personal history of malignant neoplasm of breast: Secondary | ICD-10-CM | POA: Diagnosis not present

## 2024-05-17 DIAGNOSIS — F4323 Adjustment disorder with mixed anxiety and depressed mood: Secondary | ICD-10-CM | POA: Diagnosis not present

## 2024-05-17 DIAGNOSIS — M19011 Primary osteoarthritis, right shoulder: Secondary | ICD-10-CM | POA: Diagnosis not present

## 2024-05-17 DIAGNOSIS — M25611 Stiffness of right shoulder, not elsewhere classified: Secondary | ICD-10-CM | POA: Diagnosis not present

## 2024-05-17 DIAGNOSIS — M6281 Muscle weakness (generalized): Secondary | ICD-10-CM | POA: Diagnosis not present

## 2024-05-17 DIAGNOSIS — S46011D Strain of muscle(s) and tendon(s) of the rotator cuff of right shoulder, subsequent encounter: Secondary | ICD-10-CM | POA: Diagnosis not present

## 2024-05-22 DIAGNOSIS — Z853 Personal history of malignant neoplasm of breast: Secondary | ICD-10-CM | POA: Diagnosis not present

## 2024-05-22 DIAGNOSIS — Z08 Encounter for follow-up examination after completed treatment for malignant neoplasm: Secondary | ICD-10-CM | POA: Diagnosis not present

## 2024-05-22 DIAGNOSIS — M8588 Other specified disorders of bone density and structure, other site: Secondary | ICD-10-CM | POA: Diagnosis not present

## 2024-05-22 DIAGNOSIS — R92333 Mammographic heterogeneous density, bilateral breasts: Secondary | ICD-10-CM | POA: Diagnosis not present

## 2024-05-24 DIAGNOSIS — S46011D Strain of muscle(s) and tendon(s) of the rotator cuff of right shoulder, subsequent encounter: Secondary | ICD-10-CM | POA: Diagnosis not present

## 2024-05-24 DIAGNOSIS — M19011 Primary osteoarthritis, right shoulder: Secondary | ICD-10-CM | POA: Diagnosis not present

## 2024-05-24 DIAGNOSIS — M6281 Muscle weakness (generalized): Secondary | ICD-10-CM | POA: Diagnosis not present

## 2024-05-24 DIAGNOSIS — M25611 Stiffness of right shoulder, not elsewhere classified: Secondary | ICD-10-CM | POA: Diagnosis not present

## 2024-05-31 DIAGNOSIS — F4323 Adjustment disorder with mixed anxiety and depressed mood: Secondary | ICD-10-CM | POA: Diagnosis not present

## 2024-05-31 DIAGNOSIS — M25611 Stiffness of right shoulder, not elsewhere classified: Secondary | ICD-10-CM | POA: Diagnosis not present

## 2024-05-31 DIAGNOSIS — S46011D Strain of muscle(s) and tendon(s) of the rotator cuff of right shoulder, subsequent encounter: Secondary | ICD-10-CM | POA: Diagnosis not present

## 2024-05-31 DIAGNOSIS — M19011 Primary osteoarthritis, right shoulder: Secondary | ICD-10-CM | POA: Diagnosis not present

## 2024-05-31 DIAGNOSIS — M6281 Muscle weakness (generalized): Secondary | ICD-10-CM | POA: Diagnosis not present

## 2024-06-07 DIAGNOSIS — M25611 Stiffness of right shoulder, not elsewhere classified: Secondary | ICD-10-CM | POA: Diagnosis not present

## 2024-06-07 DIAGNOSIS — M6281 Muscle weakness (generalized): Secondary | ICD-10-CM | POA: Diagnosis not present

## 2024-06-07 DIAGNOSIS — S46011D Strain of muscle(s) and tendon(s) of the rotator cuff of right shoulder, subsequent encounter: Secondary | ICD-10-CM | POA: Diagnosis not present

## 2024-06-07 DIAGNOSIS — M19011 Primary osteoarthritis, right shoulder: Secondary | ICD-10-CM | POA: Diagnosis not present

## 2024-06-13 DIAGNOSIS — M19011 Primary osteoarthritis, right shoulder: Secondary | ICD-10-CM | POA: Diagnosis not present

## 2024-06-13 DIAGNOSIS — S46011D Strain of muscle(s) and tendon(s) of the rotator cuff of right shoulder, subsequent encounter: Secondary | ICD-10-CM | POA: Diagnosis not present

## 2024-06-13 DIAGNOSIS — M6281 Muscle weakness (generalized): Secondary | ICD-10-CM | POA: Diagnosis not present

## 2024-06-13 DIAGNOSIS — M25611 Stiffness of right shoulder, not elsewhere classified: Secondary | ICD-10-CM | POA: Diagnosis not present

## 2024-06-19 DIAGNOSIS — F4323 Adjustment disorder with mixed anxiety and depressed mood: Secondary | ICD-10-CM | POA: Diagnosis not present

## 2024-06-21 DIAGNOSIS — M6281 Muscle weakness (generalized): Secondary | ICD-10-CM | POA: Diagnosis not present

## 2024-06-21 DIAGNOSIS — M25611 Stiffness of right shoulder, not elsewhere classified: Secondary | ICD-10-CM | POA: Diagnosis not present

## 2024-06-21 DIAGNOSIS — M19011 Primary osteoarthritis, right shoulder: Secondary | ICD-10-CM | POA: Diagnosis not present

## 2024-06-21 DIAGNOSIS — S46011D Strain of muscle(s) and tendon(s) of the rotator cuff of right shoulder, subsequent encounter: Secondary | ICD-10-CM | POA: Diagnosis not present

## 2024-06-29 DIAGNOSIS — M19011 Primary osteoarthritis, right shoulder: Secondary | ICD-10-CM | POA: Diagnosis not present

## 2024-07-06 DIAGNOSIS — Z853 Personal history of malignant neoplasm of breast: Secondary | ICD-10-CM | POA: Diagnosis not present

## 2024-07-06 DIAGNOSIS — R918 Other nonspecific abnormal finding of lung field: Secondary | ICD-10-CM | POA: Diagnosis not present

## 2024-07-06 DIAGNOSIS — E782 Mixed hyperlipidemia: Secondary | ICD-10-CM | POA: Diagnosis not present

## 2024-07-06 DIAGNOSIS — J300 Vasomotor rhinitis: Secondary | ICD-10-CM | POA: Diagnosis not present

## 2024-07-06 DIAGNOSIS — Z170 Estrogen receptor positive status [ER+]: Secondary | ICD-10-CM | POA: Diagnosis not present

## 2024-07-06 DIAGNOSIS — M545 Low back pain, unspecified: Secondary | ICD-10-CM | POA: Diagnosis not present

## 2024-07-06 DIAGNOSIS — Z96611 Presence of right artificial shoulder joint: Secondary | ICD-10-CM | POA: Diagnosis not present

## 2024-07-06 DIAGNOSIS — I119 Hypertensive heart disease without heart failure: Secondary | ICD-10-CM | POA: Diagnosis not present

## 2024-07-06 DIAGNOSIS — M816 Localized osteoporosis [Lequesne]: Secondary | ICD-10-CM | POA: Diagnosis not present

## 2024-07-12 DIAGNOSIS — F4323 Adjustment disorder with mixed anxiety and depressed mood: Secondary | ICD-10-CM | POA: Diagnosis not present

## 2024-07-18 ENCOUNTER — Other Ambulatory Visit: Payer: Self-pay | Admitting: Oncology

## 2024-07-24 DIAGNOSIS — F4323 Adjustment disorder with mixed anxiety and depressed mood: Secondary | ICD-10-CM | POA: Diagnosis not present

## 2024-08-07 DIAGNOSIS — F4323 Adjustment disorder with mixed anxiety and depressed mood: Secondary | ICD-10-CM | POA: Diagnosis not present

## 2024-08-15 DIAGNOSIS — F4323 Adjustment disorder with mixed anxiety and depressed mood: Secondary | ICD-10-CM | POA: Diagnosis not present

## 2024-08-29 DIAGNOSIS — F4323 Adjustment disorder with mixed anxiety and depressed mood: Secondary | ICD-10-CM | POA: Diagnosis not present

## 2024-09-05 DIAGNOSIS — C50412 Malignant neoplasm of upper-outer quadrant of left female breast: Secondary | ICD-10-CM | POA: Diagnosis not present

## 2024-09-05 DIAGNOSIS — Z170 Estrogen receptor positive status [ER+]: Secondary | ICD-10-CM | POA: Diagnosis not present

## 2024-09-12 DIAGNOSIS — F4323 Adjustment disorder with mixed anxiety and depressed mood: Secondary | ICD-10-CM | POA: Diagnosis not present

## 2024-09-13 DIAGNOSIS — H16203 Unspecified keratoconjunctivitis, bilateral: Secondary | ICD-10-CM | POA: Diagnosis not present

## 2024-09-20 DIAGNOSIS — I119 Hypertensive heart disease without heart failure: Secondary | ICD-10-CM | POA: Diagnosis not present

## 2024-09-20 DIAGNOSIS — E782 Mixed hyperlipidemia: Secondary | ICD-10-CM | POA: Diagnosis not present

## 2024-09-20 DIAGNOSIS — R918 Other nonspecific abnormal finding of lung field: Secondary | ICD-10-CM | POA: Diagnosis not present

## 2024-09-20 DIAGNOSIS — J300 Vasomotor rhinitis: Secondary | ICD-10-CM | POA: Diagnosis not present

## 2024-09-20 DIAGNOSIS — M545 Low back pain, unspecified: Secondary | ICD-10-CM | POA: Diagnosis not present

## 2024-09-20 DIAGNOSIS — M816 Localized osteoporosis [Lequesne]: Secondary | ICD-10-CM | POA: Diagnosis not present

## 2024-09-20 DIAGNOSIS — R7303 Prediabetes: Secondary | ICD-10-CM | POA: Diagnosis not present

## 2024-09-20 DIAGNOSIS — Z96611 Presence of right artificial shoulder joint: Secondary | ICD-10-CM | POA: Diagnosis not present

## 2024-09-20 DIAGNOSIS — Z853 Personal history of malignant neoplasm of breast: Secondary | ICD-10-CM | POA: Diagnosis not present

## 2024-09-20 DIAGNOSIS — Z170 Estrogen receptor positive status [ER+]: Secondary | ICD-10-CM | POA: Diagnosis not present

## 2024-09-26 DIAGNOSIS — F4323 Adjustment disorder with mixed anxiety and depressed mood: Secondary | ICD-10-CM | POA: Diagnosis not present

## 2024-10-10 DIAGNOSIS — F4323 Adjustment disorder with mixed anxiety and depressed mood: Secondary | ICD-10-CM | POA: Diagnosis not present

## 2025-01-29 ENCOUNTER — Ambulatory Visit: Admitting: Oncology
